# Patient Record
Sex: Male | Born: 1970 | Race: White | Hispanic: No | Marital: Single | Smoking: Current every day smoker
Health system: Southern US, Community
[De-identification: ages and names within clinical notes are randomized; demographics above are authoritative.]

## PROBLEM LIST (undated history)

## (undated) DIAGNOSIS — E079 Disorder of thyroid, unspecified: Secondary | ICD-10-CM

## (undated) DIAGNOSIS — I10 Essential (primary) hypertension: Secondary | ICD-10-CM

## (undated) DIAGNOSIS — G8929 Other chronic pain: Secondary | ICD-10-CM

## (undated) DIAGNOSIS — M549 Dorsalgia, unspecified: Secondary | ICD-10-CM

## (undated) HISTORY — PX: OTHER SURGICAL HISTORY: SHX169

---

## 2008-10-16 ENCOUNTER — Emergency Department (HOSPITAL_COMMUNITY): Admission: EM | Admit: 2008-10-16 | Discharge: 2008-10-16 | Payer: Self-pay | Admitting: Emergency Medicine

## 2009-08-18 ENCOUNTER — Emergency Department (HOSPITAL_COMMUNITY): Admission: EM | Admit: 2009-08-18 | Discharge: 2009-08-18 | Payer: Self-pay | Admitting: Family Medicine

## 2010-03-04 IMAGING — CR DG CHEST 2V
2 series · 2 of 2 positions shown · non-contrast
Comparison: None

CLINICAL DATA: Coughing up blood for 4 days.

CHEST - 2 VIEW

[w chest pa]
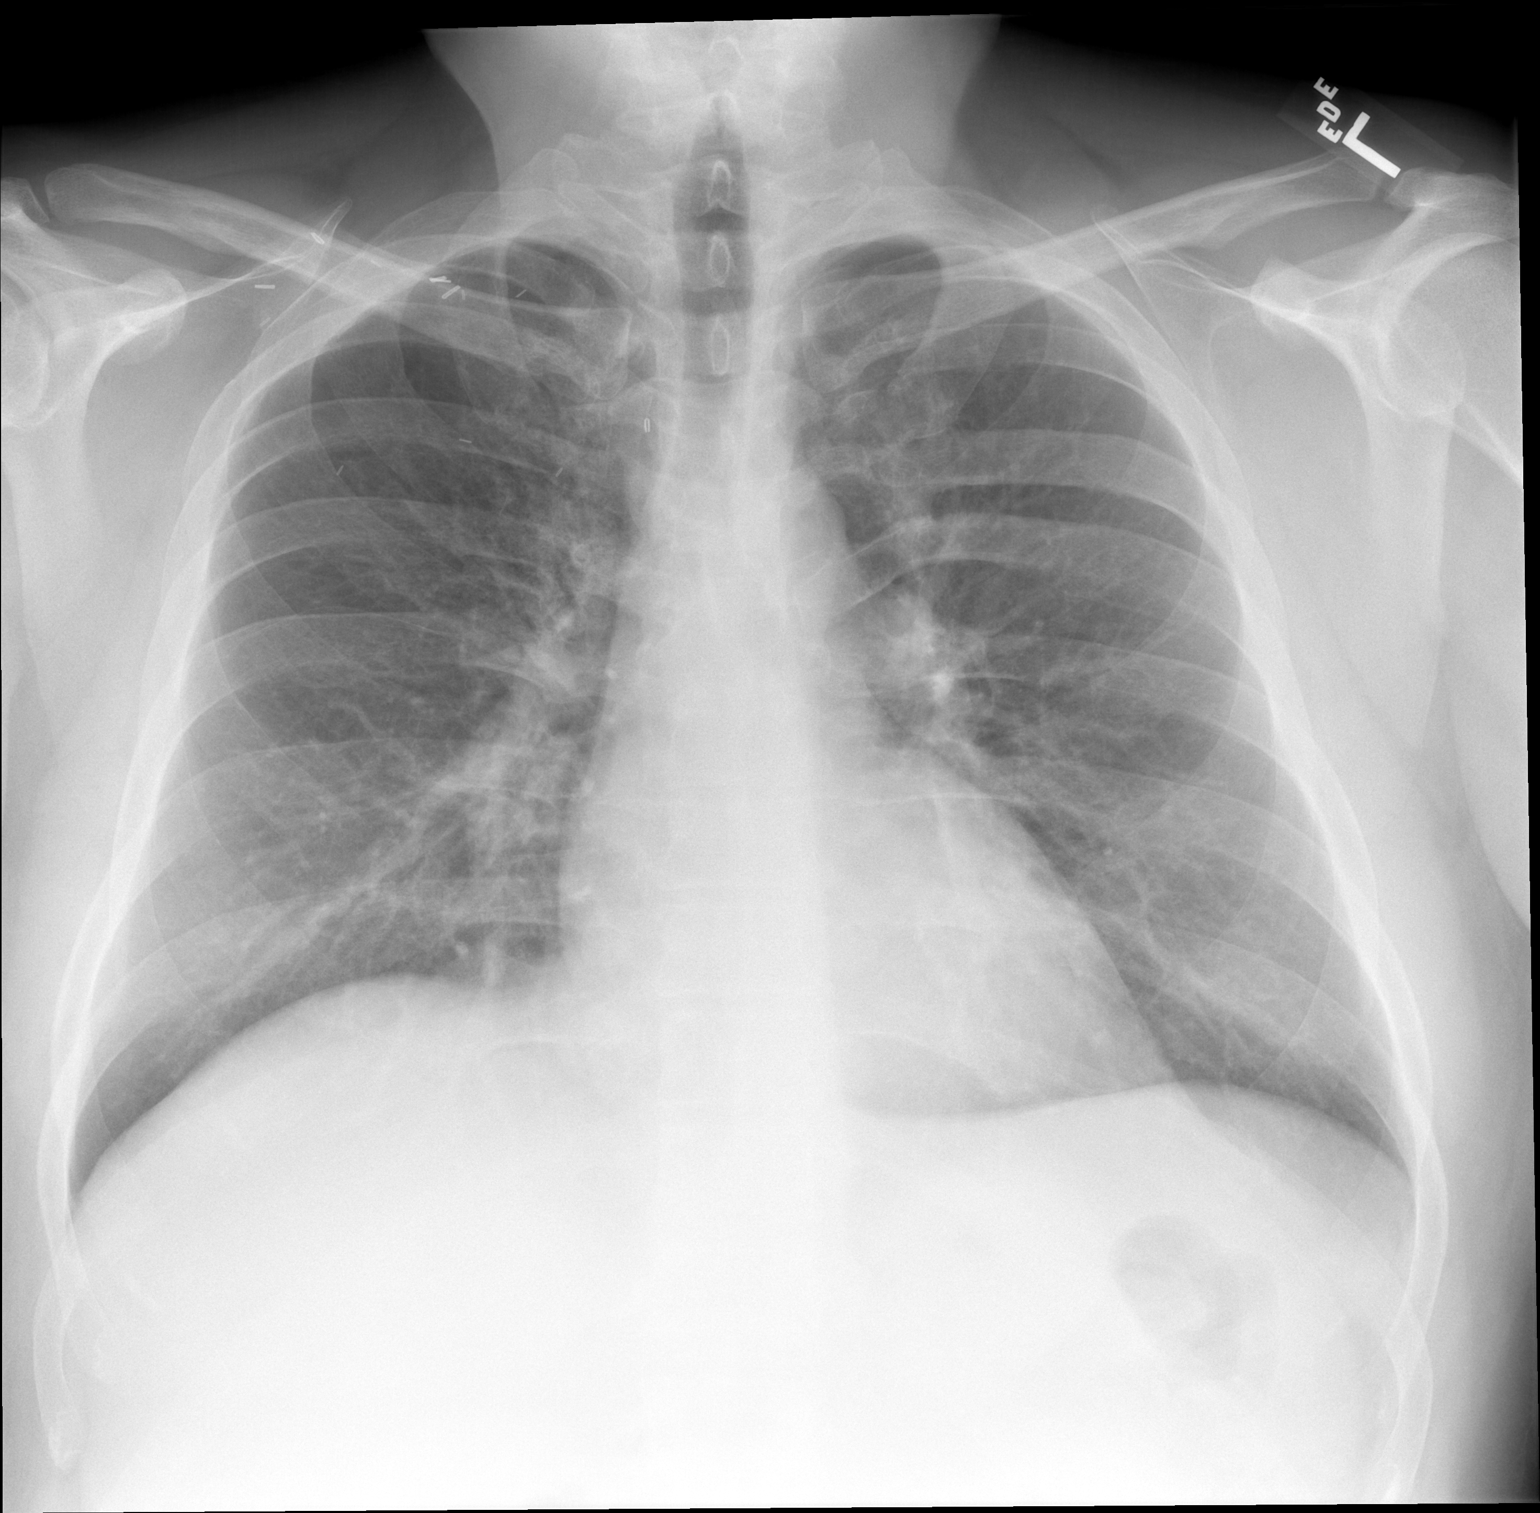

[w chest lat]
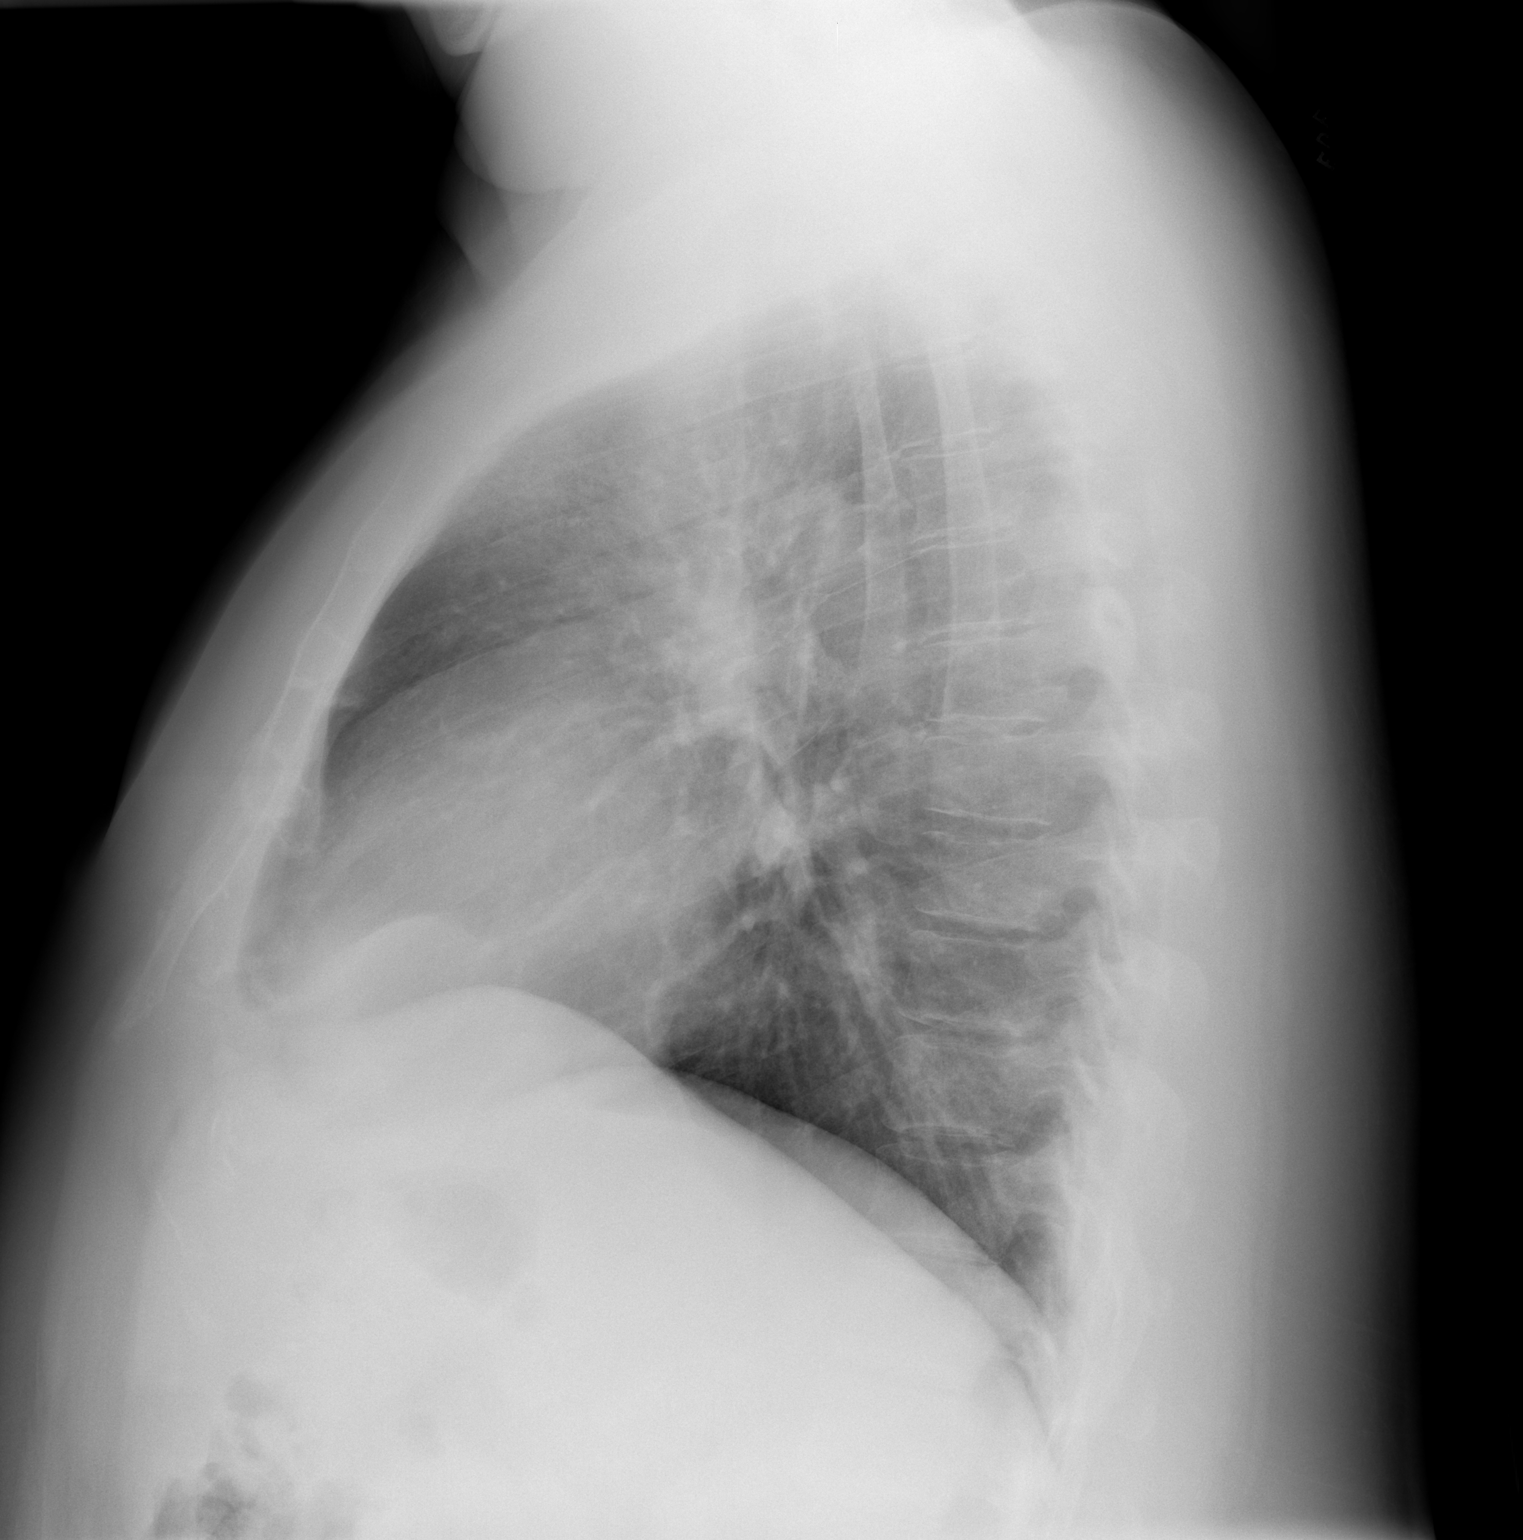

[2 of 2 positions shown; findings below may reference images not displayed]

FINDINGS: The heart size and mediastinal contours are normal.
Surgical clips overlie the right lung apex.  There is diffuse
interstitial prominence with mild central airway thickening.  No
confluent airspace opacity, mass or significant pleural effusion is
demonstrated.  The osseous structures appear normal.
IMPRESSION: 1.  Diffuse interstitial prominence suggesting mild bronchitis.  No
focal airspace disease.
2.  Radiographic followup is recommended if the patient has
persistent unexplained hemoptysis.

## 2011-09-01 LAB — POCT I-STAT, CHEM 8
BUN: 13
Creatinine, Ser: 1
Glucose, Bld: 101 — ABNORMAL HIGH
HCT: 48
Hemoglobin: 16.3
Potassium: 4
Sodium: 139

## 2014-05-10 ENCOUNTER — Encounter (HOSPITAL_COMMUNITY): Payer: Self-pay | Admitting: Emergency Medicine

## 2014-05-10 ENCOUNTER — Emergency Department (HOSPITAL_COMMUNITY)
Admission: EM | Admit: 2014-05-10 | Discharge: 2014-05-10 | Disposition: A | Discharge status: Home / Self Care | Payer: BC Managed Care – PPO | Attending: Emergency Medicine | Admitting: Emergency Medicine

## 2014-05-10 DIAGNOSIS — L03119 Cellulitis of unspecified part of limb: Principal | ICD-10-CM

## 2014-05-10 DIAGNOSIS — F17200 Nicotine dependence, unspecified, uncomplicated: Secondary | ICD-10-CM | POA: Insufficient documentation

## 2014-05-10 DIAGNOSIS — L02519 Cutaneous abscess of unspecified hand: Secondary | ICD-10-CM | POA: Insufficient documentation

## 2014-05-10 DIAGNOSIS — L0291 Cutaneous abscess, unspecified: Secondary | ICD-10-CM

## 2014-05-10 MED ORDER — LIDOCAINE HCL (PF) 2 % IJ SOLN
INTRAMUSCULAR | Status: AC
  Filled 2014-05-10: qty 10

## 2014-05-10 MED ORDER — SULFAMETHOXAZOLE-TMP DS 800-160 MG PO TABS: 1.0000 | ORAL_TABLET | Freq: Two times a day (BID) | ORAL | Status: DC

## 2014-05-10 MED ORDER — LIDOCAINE HCL (PF) 2 % IJ SOLN: 10.0000 mL | Freq: Once | INTRAMUSCULAR | Status: DC

## 2014-05-10 NOTE — Discharge Instructions (Signed)
Return to the ER on Sunday for a repeat examination and packing removal. Return sooner if you have increasing pain or swelling. Leave the dressing in place until you are seen on Sunday. You will also need to schedule followup with the hand surgeon listed, but that will not be able to happen until after the weekend.  Abscess An abscess is an infected area that contains a collection of pus and debris.It can occur in almost any part of the body. An abscess is also known as a furuncle or boil. CAUSES  An abscess occurs when tissue gets infected. This can occur from blockage of oil or sweat glands, infection of hair follicles, or a minor injury to the skin. As the body tries to fight the infection, pus collects in the area and creates pressure under the skin. This pressure causes pain. People with weakened immune systems have difficulty fighting infections and get certain abscesses more often.  SYMPTOMS Usually an abscess develops on the skin and becomes a painful mass that is red, warm, and tender. If the abscess forms under the skin, you may feel a moveable soft area under the skin. Some abscesses break open (rupture) on their own, but most will continue to get worse without care. The infection can spread deeper into the body and eventually into the bloodstream, causing you to feel ill.  DIAGNOSIS  Your caregiver will take your medical history and perform a physical exam. A sample of fluid may also be taken from the abscess to determine what is causing your infection. TREATMENT  Your caregiver may prescribe antibiotic medicines to fight the infection. However, taking antibiotics alone usually does not cure an abscess. Your caregiver may need to make a small cut (incision) in the abscess to drain the pus. In some cases, gauze is packed into the abscess to reduce pain and to continue draining the area. HOME CARE INSTRUCTIONS   Only take over-the-counter or prescription medicines for pain, discomfort, or  fever as directed by your caregiver.  If you were prescribed antibiotics, take them as directed. Finish them even if you start to feel better.  If gauze is used, follow your caregiver's directions for changing the gauze.  To avoid spreading the infection:  Keep your draining abscess covered with a bandage.  Wash your hands well.  Do not share personal care items, towels, or whirlpools with others.  Avoid skin contact with others.  Keep your skin and clothes clean around the abscess.  Keep all follow-up appointments as directed by your caregiver. SEEK MEDICAL CARE IF:   You have increased pain, swelling, redness, fluid drainage, or bleeding.  You have muscle aches, chills, or a general ill feeling.  You have a fever. MAKE SURE YOU:   Understand these instructions.  Will watch your condition.  Will get help right away if you are not doing well or get worse. Document Released: 08/25/2005 Document Revised: 05/16/2012 Document Reviewed: 01/28/2012 [facility] Patient Information 2014 [place], [place].

## 2014-05-10 NOTE — ED Notes (Signed)
Abscess to lt hand ,

## 2014-05-10 NOTE — ED Notes (Signed)
Abscess to left hand x 2 days. Hx of same to same hand, last time approx 6 months ago. Redness and swelling noted. No drainage from site. Denies fevers.

## 2014-05-10 NOTE — ED Provider Notes (Signed)
CSN: [id number]     Arrival date & time 05/10/14  1519 History   First MD Initiated Contact with Patient 05/10/14 1550     Chief Complaint  Patient presents with  . Abscess     (Consider location/radiation/quality/duration/timing/severity/associated sxs/prior Treatment) HPI Comments: Presents to the ER for evaluation of pain, swelling and redness over the back of the left hand. Symptoms began several days ago and have progressively worsened. Patient reports that he has had an abscess in this area before. He denies any direct injury to the region. There has not been any fever. There is no drainage from the site. Patient has not noticed any numbness, tingling or weakness in the fingers. He has full range of motion of the hand.  Patient is a 43 y.o. male presenting with abscess.  Abscess   History reviewed. No pertinent past medical history. Past Surgical History  Procedure Laterality Date  . Desmoid tumor removed     History reviewed. No pertinent family history. History  Substance Use Topics  . Smoking status: Current Every Day Smoker  . Smokeless tobacco: Not on file  . Alcohol Use: No    Review of Systems  Skin: Positive for color change.  All other systems reviewed and are negative.     Allergies  Review of patient's allergies indicates no known allergies.  Home Medications   Prior to Admission medications   Not on File   BP 163/105  Pulse 82  Temp(Src) 98.2 F (36.8 C) (Oral)  Resp 20  Ht 6' (1.829 m)  Wt 300 lb (136.079 kg)  BMI 40.68 kg/m2  SpO2 99% Physical Exam  Constitutional: He is oriented to person, place, and time. He appears well-developed and well-nourished. No distress.  HENT:  Head: Normocephalic and atraumatic.  Right Ear: Hearing normal.  Left Ear: Hearing normal.  Nose: Nose normal.  Mouth/Throat: Oropharynx is clear and moist and mucous membranes are normal.  Eyes: Conjunctivae and EOM are normal. Pupils are equal, round, and reactive  to light.  Neck: Normal range of motion. Neck supple.  Cardiovascular: Regular rhythm, S1 normal and S2 normal.  Exam reveals no gallop and no friction rub.   No murmur heard. Pulmonary/Chest: Effort normal and breath sounds normal. No respiratory distress. He exhibits no tenderness.  Abdominal: Soft. Normal appearance and bowel sounds are normal. There is no hepatosplenomegaly. There is no tenderness. There is no rebound, no guarding, no tenderness at McBurney's point and negative Murphy's sign. No hernia.  Musculoskeletal: Normal range of motion.       Hands: Neurological: He is alert and oriented to person, place, and time. He has normal strength. No cranial nerve deficit or sensory deficit. Coordination normal. GCS eye subscore is 4. GCS verbal subscore is 5. GCS motor subscore is 6.  Skin: Skin is warm, dry and intact. No rash noted. No cyanosis.     Psychiatric: He has a normal mood and affect. His speech is normal and behavior is normal. Thought content normal.    ED Course  Procedures (including critical care time)  INCISION AND DRAINAGE Performed by: Gilda CreasePOLLINA, CHRISTOPHER J. Consent: Verbal consent obtained. Risks and benefits: risks, benefits and alternatives were discussed Type: abscess  Body area: dorsal left hand  Anesthesia: local infiltration  Incision was made with a scalpel.  Local anesthetic: lidocaine 2% without epinephrine  Anesthetic total: 3 ml  Complexity: complex Blunt dissection to break up loculations  Drainage: purulent  Drainage amount: large  Packing material: 1/4 in  iodoform gauze  Patient tolerance: Patient tolerated the procedure well with no immediate complications.     Labs Review Labs Reviewed - No data to display  Imaging Review No results found.   EKG Interpretation None      MDM   Final diagnoses:  None  Abscess  Presents to ER for evaluation of swollen area on the back of her left hand. He had a tender fluctuant  region that was consistent with an abscess. This appeared to be contained within the subcutaneous tissues, as he did not have any pain with range of motion of his fingers and wrist. He has normal preserved range of motion, specifically extension, at all 4 fingers. I Recommended incision and drainage, patient gave consent after discussing risks and benefits. Procedure was performed a large amount of pus was drained from the area. It was packed and patient will be initiated on Bactrim. I will have him come back to the ER in 2 days (Sunday) for packing removal and recheck, also refer to hand surgery, but that will not be possible until next week.   Gilda Creasehristopher J. Pollina, MD 05/10/14 [id number]

## 2014-05-14 LAB — CULTURE, ROUTINE-ABSCESS

## 2014-07-04 ENCOUNTER — Ambulatory Visit (INDEPENDENT_AMBULATORY_CARE_PROVIDER_SITE_OTHER): Payer: BC Managed Care – PPO | Admitting: Nurse Practitioner

## 2014-07-04 ENCOUNTER — Encounter (INDEPENDENT_AMBULATORY_CARE_PROVIDER_SITE_OTHER): Payer: Self-pay

## 2014-07-04 VITALS — BP 142/77 | HR 87 | Temp 97.7°F | Ht 72.0 in | Wt 280.0 lb

## 2014-07-04 DIAGNOSIS — L02511 Cutaneous abscess of right hand: Secondary | ICD-10-CM

## 2014-07-04 DIAGNOSIS — L02519 Cutaneous abscess of unspecified hand: Secondary | ICD-10-CM

## 2014-07-04 DIAGNOSIS — M79609 Pain in unspecified limb: Secondary | ICD-10-CM

## 2014-07-04 DIAGNOSIS — L03119 Cellulitis of unspecified part of limb: Secondary | ICD-10-CM

## 2014-07-04 MED ORDER — CEFTRIAXONE SODIUM 1 G IJ SOLR
1.0000 g | INTRAMUSCULAR | Status: AC
  Administered 2014-07-04: 1 g via INTRAMUSCULAR

## 2014-07-04 MED ORDER — CEPHALEXIN 500 MG PO CAPS: 500.0000 mg | ORAL_CAPSULE | Freq: Four times a day (QID) | ORAL | Status: DC

## 2014-07-04 NOTE — Progress Notes (Signed)
   Subjective:    Patient ID: Joseph Stafford, male    DOB: 25-Nov-1971, 43 y.o.   MRN: [id number]  HPI  Swelling, redness, and pain in right hand 3 days ago.  Not aware of any insect bites.    Review of Systems  Constitutional: Negative.   Respiratory: Negative.   Cardiovascular: Negative.   Skin: Positive for color change.       Right hand red and swollen       Objective:   Physical Exam  Constitutional: He appears well-developed and well-nourished. He appears distressed.  Skin: Skin is warm.  Right hand red with swelling above wrist  Psychiatric: His mood appears anxious.    Procedure:  Lidocaine 2% with epi 2ml local  claeaned with betadine  #11 blade- copious amounts of foul smelling pus- culture obtained  Cleaned with NACL  Pressure dressing applied      Assessment & Plan:   1. Abscess of right hand    Meds ordered this encounter  Medications  . cefTRIAXone (ROCEPHIN) injection 1 g    Sig:     Order Specific Question:  Antibiotic Indication:    Answer:  Cellulitis  . cephALEXin (KEFLEX) 500 MG capsule    Sig: Take 1 capsule (500 mg total) by mouth 4 (four) times daily.    Dispense:  30 capsule    Refill:  0    Order Specific Question:  Supervising Provider    Answer:  Christell ConstantMOORE, DONALD W [[id number]]   Soak in epsom salt BID starting tomorrow Keep clean dry and covered Take antibiotics as rx RTO if not improving  Mary-Margaret Daphine DeutscherMartin, FNP

## 2014-07-04 NOTE — Patient Instructions (Signed)

## 2014-07-07 LAB — AEROBIC CULTURE

## 2017-01-24 ENCOUNTER — Encounter (HOSPITAL_COMMUNITY): Payer: Self-pay | Admitting: Emergency Medicine

## 2017-01-24 ENCOUNTER — Emergency Department (HOSPITAL_COMMUNITY)
Admission: EM | Admit: 2017-01-24 | Discharge: 2017-01-24 | Disposition: A | Discharge status: Home / Self Care | Payer: BLUE CROSS/BLUE SHIELD | Attending: Emergency Medicine | Admitting: Emergency Medicine

## 2017-01-24 DIAGNOSIS — Z79899 Other long term (current) drug therapy: Secondary | ICD-10-CM | POA: Diagnosis not present

## 2017-01-24 DIAGNOSIS — I10 Essential (primary) hypertension: Secondary | ICD-10-CM | POA: Insufficient documentation

## 2017-01-24 DIAGNOSIS — F17200 Nicotine dependence, unspecified, uncomplicated: Secondary | ICD-10-CM | POA: Diagnosis not present

## 2017-01-24 DIAGNOSIS — L0291 Cutaneous abscess, unspecified: Secondary | ICD-10-CM

## 2017-01-24 DIAGNOSIS — L02511 Cutaneous abscess of right hand: Secondary | ICD-10-CM | POA: Diagnosis not present

## 2017-01-24 HISTORY — DX: Disorder of thyroid, unspecified: E07.9

## 2017-01-24 HISTORY — DX: Essential (primary) hypertension: I10

## 2017-01-24 HISTORY — DX: Dorsalgia, unspecified: M54.9

## 2017-01-24 HISTORY — DX: Other chronic pain: G89.29

## 2017-01-24 LAB — CBC WITH DIFFERENTIAL/PLATELET
BASOS PCT: 0 %
Basophils Absolute: 0 10*3/uL (ref 0.0–0.1)
Eosinophils Absolute: 0.3 10*3/uL (ref 0.0–0.7)
Eosinophils Relative: 4 %
HEMATOCRIT: 40.8 % (ref 39.0–52.0)
HEMOGLOBIN: 13.9 g/dL (ref 13.0–17.0)
LYMPHS PCT: 17 %
Lymphs Abs: 1.5 10*3/uL (ref 0.7–4.0)
MCH: 29.8 pg (ref 26.0–34.0)
MCHC: 34.1 g/dL (ref 30.0–36.0)
MCV: 87.4 fL (ref 78.0–100.0)
MONO ABS: 0.6 10*3/uL (ref 0.1–1.0)
MONOS PCT: 7 %
NEUTROS ABS: 6.3 10*3/uL (ref 1.7–7.7)
NEUTROS PCT: 72 %
Platelets: 339 10*3/uL (ref 150–400)
RBC: 4.67 MIL/uL (ref 4.22–5.81)
RDW: 14.2 % (ref 11.5–15.5)
WBC: 8.8 10*3/uL (ref 4.0–10.5)

## 2017-01-24 LAB — COMPREHENSIVE METABOLIC PANEL
ALBUMIN: 3.6 g/dL (ref 3.5–5.0)
ALK PHOS: 80 U/L (ref 38–126)
ALT: 48 U/L (ref 17–63)
ANION GAP: 8 (ref 5–15)
AST: 29 U/L (ref 15–41)
BILIRUBIN TOTAL: 0.2 mg/dL — AB (ref 0.3–1.2)
BUN: 15 mg/dL (ref 6–20)
CALCIUM: 9 mg/dL (ref 8.9–10.3)
CO2: 24 mmol/L (ref 22–32)
Chloride: 105 mmol/L (ref 101–111)
Creatinine, Ser: 0.88 mg/dL (ref 0.61–1.24)
GFR calc Af Amer: >60 mL/min (ref >60)
GLUCOSE: 118 mg/dL — AB (ref 65–99)
Potassium: 4.2 mmol/L (ref 3.5–5.1)
Sodium: 137 mmol/L (ref 135–145)
TOTAL PROTEIN: 7.5 g/dL (ref 6.5–8.1)

## 2017-01-24 MED ORDER — OXYCODONE-ACETAMINOPHEN 5-325 MG PO TABS: 1.0000 | ORAL_TABLET | Freq: Four times a day (QID) | ORAL | 0 refills | Status: DC | PRN

## 2017-01-24 MED ORDER — POVIDONE-IODINE 10 % EX SOLN
CUTANEOUS | Status: AC
  Administered 2017-01-24: 19:00:00
  Filled 2017-01-24: qty 118

## 2017-01-24 MED ORDER — ONDANSETRON HCL 4 MG/2ML IJ SOLN
4.0000 mg | Freq: Once | INTRAMUSCULAR | Status: AC
  Administered 2017-01-24: 4 mg via INTRAVENOUS
  Filled 2017-01-24: qty 2

## 2017-01-24 MED ORDER — LIDOCAINE HCL (PF) 2 % IJ SOLN
10.0000 mL | Freq: Once | INTRAMUSCULAR | Status: AC
  Administered 2017-01-24: 10 mL
  Filled 2017-01-24: qty 10

## 2017-01-24 MED ORDER — HYDROMORPHONE HCL 1 MG/ML IJ SOLN
1.0000 mg | Freq: Once | INTRAMUSCULAR | Status: AC
  Administered 2017-01-24: 1 mg via INTRAVENOUS
  Filled 2017-01-24: qty 1

## 2017-01-24 MED ORDER — DOXYCYCLINE HYCLATE 100 MG PO CAPS: 100.0000 mg | ORAL_CAPSULE | Freq: Two times a day (BID) | ORAL | 0 refills | Status: DC

## 2017-01-24 MED ORDER — VANCOMYCIN HCL IN DEXTROSE 1-5 GM/200ML-% IV SOLN
1000.0000 mg | Freq: Once | INTRAVENOUS | Status: AC
  Administered 2017-01-24: 1000 mg via INTRAVENOUS
  Filled 2017-01-24: qty 200

## 2017-01-24 NOTE — ED Provider Notes (Signed)
[facility]EMER[facility]ENCY DEPT Provider Note   CSN: [id number] Arrival date & time: 01/24/17  1707     History   Chief Complaint Chief Complaint  Patient presents with  . Abscess    HPI Joseph Stafford is a 46 y.o. male.  Patient complains of swelling to his right wrist ulcer to his right leg and small abscess to abdomen   The history is provided by the patient. No language interpreter was used.  Abscess  Abscess location: Right hand. Abscess quality: fluctuance   Red streaking: yes   Progression:  Worsening Chronicity:  New Context: not diabetes   Associated symptoms: no fatigue and no headaches     Past Medical History:  Diagnosis Date  . Chronic back pain   . Hypertension   . Thyroid disease     There are no active problems to display for this patient.   Past Surgical History:  Procedure Laterality Date  . desmoid tumor removed         Home Medications    Prior to Admission medications   Medication Sig Start Date End Date Taking? Authorizing Provider  ALPRAZolam Prudy Feeler(XANAX) 1 MG tablet Take 1 mg by mouth daily. 05/03/14   Historical Provider, MD  cephALEXin (KEFLEX) 500 MG capsule Take 1 capsule (500 mg total) by mouth 4 (four) times daily. 07/04/14   Mary-Margaret Daphine DeutscherMartin, FNP  cloNIDine (CATAPRES) 0.2 MG tablet Take 1 tablet by mouth 2 (two) times daily. 04/26/14   Historical Provider, MD  Cyanocobalamin (B-12 PO) Take 1 tablet by mouth every morning.    Historical Provider, MD  doxycycline (VIBRAMYCIN) 100 MG capsule Take 1 capsule (100 mg total) by mouth 2 (two) times daily. One po bid x 7 days 01/24/17   Bethann BerkshireJoseph Shion Bluestein, MD  fentaNYL (DURAGESIC - DOSED MCG/HR) 50 MCG/HR Place 1 patch onto the skin every other day. Changes patches every 2 days 04/28/14   Historical Provider, MD  hydrochlorothiazide (HYDRODIURIL) 25 MG tablet Take 1 tablet by mouth every morning. 04/26/14   Historical Provider, MD  levothyroxine (SYNTHROID, LEVOTHROID) 75 MCG tablet 1 tablet every morning.  04/26/14   Historical Provider, MD  losartan (COZAAR) 100 MG tablet Take 100 mg by mouth every morning. 04/26/14   Historical Provider, MD  oxycodone (ROXICODONE) 30 MG immediate release tablet Take 30 mg by mouth 4 (four) times daily. 04/28/14   Historical Provider, MD  oxyCODONE-acetaminophen (PERCOCET/ROXICET) 5-325 MG tablet Take 1 tablet by mouth every 6 (six) hours as needed. 01/24/17   Bethann BerkshireJoseph Ezequias Lard, MD    Family History History reviewed. No pertinent family history.  Social History Social History  Substance Use Topics  . Smoking status: Current Every Day Smoker  . Smokeless tobacco: Never Used  . Alcohol use No     Allergies   Ivp dye [iodinated diagnostic agents]   Review of Systems Review of Systems  Constitutional: Negative for appetite change and fatigue.  HENT: Negative for congestion, ear discharge and sinus pressure.   Eyes: Negative for discharge.  Respiratory: Negative for cough.   Cardiovascular: Negative for chest pain.  Gastrointestinal: Negative for abdominal pain and diarrhea.  Genitourinary: Negative for frequency and hematuria.  Musculoskeletal: Negative for back pain.  Skin: Negative for rash.       Abscess to right hand  Neurological: Negative for seizures and headaches.  Psychiatric/Behavioral: Negative for hallucinations.     Physical Exam Updated Vital Signs BP 182/92 (BP Location: Left Arm)   Pulse 88   Temp 98 F (  36.7 C) (Oral)   Resp 19   Ht 6' (1.829 m)   Wt 260 lb (117.9 kg)   SpO2 97%   BMI 35.26 kg/m   Physical Exam  Constitutional: He is oriented to person, place, and time. He appears well-developed.  HENT:  Head: Normocephalic.  Eyes: Conjunctivae and EOM are normal. No scleral icterus.  Neck: Neck supple. No thyromegaly present.  Cardiovascular: Normal rate and regular rhythm.  Exam reveals no gallop and no friction rub.   No murmur heard. Pulmonary/Chest: No stridor. He has no wheezes. He has no rales. He exhibits no  tenderness.  Abdominal: He exhibits no distension. There is no tenderness. There is no rebound.  Patient has a 1 cm area of redness to his abdomen that appears to be infected.  Musculoskeletal: Normal range of motion. He exhibits no edema.  Patient has a large abscess to the back with hand. Abscesses on his right hand. Patient also has an ulcer to his right ankle  Lymphadenopathy:    He has no cervical adenopathy.  Neurological: He is oriented to person, place, and time. He exhibits normal muscle tone. Coordination normal.  Skin: No rash noted. No erythema.  Psychiatric: He has a normal mood and affect. His behavior is normal.     ED Treatments / Results  Labs (all labs ordered are listed, but only abnormal results are displayed) Labs Reviewed  COMPREHENSIVE METABOLIC PANEL - Abnormal; Notable for the following:       Result Value   Glucose, Bld 118 (*)    Total Bilirubin 0.2 (*)    All other components within normal limits  AEROBIC CULTURE (SUPERFICIAL SPECIMEN)  CBC WITH DIFFERENTIAL/PLATELET    EKG  EKG Interpretation None       Radiology No results found.  Procedures Procedures (including critical care time)  Medications Ordered in ED Medications  vancomycin (VANCOCIN) IVPB 1000 mg/200 mL premix (0 mg Intravenous Stopped 01/24/17 1921)  HYDROmorphone (DILAUDID) injection 1 mg (1 mg Intravenous Given 01/24/17 1821)  ondansetron (ZOFRAN) injection 4 mg (4 mg Intravenous Given 01/24/17 1821)  lidocaine (XYLOCAINE) 2 % injection 10 mL (10 mLs Infiltration Given 01/24/17 1937)  povidone-iodine (BETADINE) 10 % external solution (  Given 01/24/17 1910)     Initial Impression / Assessment and Plan / ED Course  I have reviewed the triage vital signs and the nursing notes.  Pertinent labs & imaging results that were available during my care of the patient were reviewed by me and considered in my medical decision making (see chart for details).     Patient with abscess to  right hand that was high and D. Cultures were done. Patient will be put on doxycycline and follow-up with Dr. Romeo Apple  Final Clinical Impressions(s) / ED Diagnoses   Final diagnoses:  Abscess    New Prescriptions New Prescriptions   DOXYCYCLINE (VIBRAMYCIN) 100 MG CAPSULE    Take 1 capsule (100 mg total) by mouth 2 (two) times daily. One po bid x 7 days   OXYCODONE-ACETAMINOPHEN (PERCOCET/ROXICET) 5-325 MG TABLET    Take 1 tablet by mouth every 6 (six) hours as needed.     Bethann Berkshire, MD 01/24/17 [phone]

## 2017-01-24 NOTE — ED Notes (Signed)
Pt states understanding of care given and follow up instructions.  Pt ambulated from Ed with steady gait, alert, and oriented

## 2017-01-24 NOTE — ED Notes (Signed)
Attempted to obtain IV in right AC. Unsuccessful.

## 2017-01-24 NOTE — ED Triage Notes (Signed)
Pt c/o abscess to R foot x 1 week, one to right hand and one to R upper abd. States all came up over last week. Pt concerned for mrsa

## 2017-01-24 NOTE — Discharge Instructions (Signed)
Follow-up with Dr. Romeo AppleHarrison on Wednesday at 9 AM. Follow-up with her family doctor in the next week

## 2017-01-24 NOTE — ED Provider Notes (Signed)
INCISION AND DRAINAGE Performed by: Jimmye NormanSMITH,Carlo Lorson JOHN Consent: Verbal consent obtained. Risks and benefits: risks, benefits and alternatives were discussed Type: abscess  Body area: dorsum right hand  Anesthesia: local infiltration  Incision was made with a scalpel.  Local anesthetic: lidocaine 2%   Anesthetic total: 2 ml  Complexity: complex  Blunt dissection to break up loculations  Drainage: purulent  Drainage amount: moderate  Packing material: 1/4 in iodoform gauze  Patient tolerance: Patient tolerated the procedure well with no immediate complications.     Felicie Mornavid Tylie Golonka, NP 01/24/17 1934    Bethann BerkshireJoseph Zammit, MD 01/24/17 2127

## 2017-01-28 LAB — AEROBIC CULTURE  (SUPERFICIAL SPECIMEN): SPECIAL REQUESTS: NORMAL

## 2017-01-28 LAB — AEROBIC CULTURE W GRAM STAIN (SUPERFICIAL SPECIMEN)

## 2017-01-29 ENCOUNTER — Telehealth: Payer: Self-pay | Admitting: *Deleted

## 2017-01-29 NOTE — Progress Notes (Signed)
ED Antimicrobial Stewardship Positive Culture Follow Up   Joseph Stafford is an 46 y.o. male [facility] with a chief complaint of  Chief Complaint  Patient presents with  . Abscess    Recent Results (from the past 720 hour(s))  Wound or Superficial Culture     Status: None   Collection Time: 01/24/17  7:30 PM  Result Value Ref Range Status   Specimen Description ABSCESS  Final   Special Requests Normal  Final   Gram Stain   Final    FEW WBC PRESENT,BOTH PMN AND MONONUCLEAR FEW GRAM POSITIVE COCCI Performed at [facility], [address] [place], [place] [address]    Culture FEW STREPTOCOCCUS INTERMEDIUS  Final   Report Status 01/28/2017 FINAL  Final   Organism ID, Bacteria STREPTOCOCCUS INTERMEDIUS  Final      Susceptibility   Streptococcus intermedius - MIC*    PENICILLIN <=0.06 SENSITIVE Sensitive     CEFTRIAXONE <=0.12 SENSITIVE Sensitive     ERYTHROMYCIN >=8 RESISTANT Resistant     LEVOFLOXACIN 0.5 SENSITIVE Sensitive     VANCOMYCIN 0.5 SENSITIVE Sensitive     * FEW STREPTOCOCCUS INTERMEDIUS   Will call pt for symptom check If improved, cont doxy If no improvement change to augmentin 875 bid x 10 d  ED Provider: Willette ClusterWill Dansie, Joseph Stafford  Joseph Stafford 01/29/2017, 8:43 AM Infectious Diseases Pharmacist Phone# [phone]

## 2017-01-29 NOTE — Telephone Encounter (Signed)
Post ED Visit - Positive Culture Follow-up  Culture report reviewed by antimicrobial stewardship pharmacist:  []  Enzo BiNathan Batchelder, Pharm.D. []  Celedonio MiyamotoJeremy Frens, Pharm.D., BCPS []  Garvin FilaMike Maccia, Pharm.D. []  Georgina PillionElizabeth Martin, Pharm.D., BCPS []  Turkey CreekMinh Pham, [place]., BCPS, AAHIVP []  Estella HuskMichelle Turner, Pharm.D., BCPS, AAHIVP []  Tennis Mustassie Stewart, Pharm.D. []  Sherle Poeob Vincent, [place]D.  Positive wound culture  Chart reviewed by Betsy CoderWill Dansie, PA.  Treated with Doxycycline at ED visit, recommends follow up treatment with Augmentin if wound not improved.  Spoke with spouse of patient who states that wound is healing and much improved, no further treatment provided. Virl AxeRobertson, Curtisha [facility] 01/29/2017, 9:40 AM

## 2017-03-01 ENCOUNTER — Telehealth: Payer: Self-pay | Admitting: Emergency Medicine

## 2017-03-01 NOTE — Telephone Encounter (Signed)
LOST TO FOLLOWUP no response to letter 

## 2017-03-14 ENCOUNTER — Ambulatory Visit: Payer: Self-pay | Admitting: Physician Assistant

## 2017-03-28 ENCOUNTER — Ambulatory Visit (INDEPENDENT_AMBULATORY_CARE_PROVIDER_SITE_OTHER): Payer: BLUE CROSS/BLUE SHIELD | Admitting: Physician Assistant

## 2017-03-28 ENCOUNTER — Encounter: Payer: Self-pay | Admitting: Physician Assistant

## 2017-03-28 VITALS — BP 186/120 | HR 92 | Temp 98.7°F | Ht 72.0 in | Wt 247.0 lb

## 2017-03-28 DIAGNOSIS — G894 Chronic pain syndrome: Secondary | ICD-10-CM | POA: Diagnosis not present

## 2017-03-28 DIAGNOSIS — I10 Essential (primary) hypertension: Secondary | ICD-10-CM | POA: Insufficient documentation

## 2017-03-28 DIAGNOSIS — M5136 Other intervertebral disc degeneration, lumbar region: Secondary | ICD-10-CM | POA: Diagnosis not present

## 2017-03-28 DIAGNOSIS — E039 Hypothyroidism, unspecified: Secondary | ICD-10-CM | POA: Diagnosis not present

## 2017-03-28 DIAGNOSIS — Z86018 Personal history of other benign neoplasm: Secondary | ICD-10-CM

## 2017-03-28 MED ORDER — COMBIVENT RESPIMAT 20-100 MCG/ACT IN AERS: 1.0000 | INHALATION_SPRAY | Freq: Four times a day (QID) | RESPIRATORY_TRACT | 11 refills | Status: DC | PRN

## 2017-03-28 MED ORDER — OXYCODONE HCL 30 MG PO TABS: 30.0000 mg | ORAL_TABLET | Freq: Four times a day (QID) | ORAL | 0 refills | Status: DC

## 2017-03-28 MED ORDER — OXYCODONE HCL 30 MG PO TABS: 30.0000 mg | ORAL_TABLET | ORAL | 0 refills | Status: DC | PRN

## 2017-03-28 MED ORDER — TOPIRAMATE 25 MG PO TABS: 25.0000 mg | ORAL_TABLET | Freq: Every day | ORAL | 5 refills | Status: DC

## 2017-03-28 MED ORDER — LISINOPRIL 20 MG PO TABS: 20.0000 mg | ORAL_TABLET | Freq: Every day | ORAL | 3 refills | Status: DC

## 2017-03-28 NOTE — Patient Instructions (Signed)
DASH Eating Plan DASH stands for "Dietary Approaches to Stop Hypertension." The DASH eating plan is a healthy eating plan that has been shown to reduce high blood pressure (hypertension). It may also reduce your risk for type 2 diabetes, heart disease, and stroke. The DASH eating plan may also help with weight loss. What are tips for following this plan? General guidelines  Avoid eating more than 2,300 mg (milligrams) of salt (sodium) a day. If you have hypertension, you may need to reduce your sodium intake to 1,500 mg a day.  Limit alcohol intake to no more than 1 drink a day for nonpregnant women and 2 drinks a day for men. One drink equals 12 oz of beer, 5 oz of wine, or 1 oz of hard liquor.  Work with your health care provider to maintain a healthy body weight or to lose weight. Ask what an ideal weight is for you.  Get at least 30 minutes of exercise that causes your heart to beat faster (aerobic exercise) most days of the week. Activities may include walking, swimming, or biking.  Work with your health care provider or diet and nutrition specialist (dietitian) to adjust your eating plan to your individual calorie needs. Reading food labels  Check food labels for the amount of sodium per serving. Choose foods with less than 5 percent of the Daily Value of sodium. Generally, foods with less than 300 mg of sodium per serving fit into this eating plan.  To find whole grains, look for the word "whole" as the first word in the ingredient list. Shopping  Buy products labeled as "low-sodium" or "no salt added."  Buy fresh foods. Avoid canned foods and premade or frozen meals. Cooking  Avoid adding salt when cooking. Use salt-free seasonings or herbs instead of table salt or sea salt. Check with your health care provider or pharmacist before using salt substitutes.  Do not fry foods. Cook foods using healthy methods such as baking, boiling, grilling, and broiling instead.  Cook with  heart-healthy oils, such as olive, canola, soybean, or sunflower oil. Meal planning   Eat a balanced diet that includes: ? 5 or more servings of fruits and vegetables each day. At each meal, try to fill half of your plate with fruits and vegetables. ? Up to 6-8 servings of whole grains each day. ? Less than 6 oz of lean meat, poultry, or fish each day. A 3-oz serving of meat is about the same size as a deck of cards. One egg equals 1 oz. ? 2 servings of low-fat dairy each day. ? A serving of nuts, seeds, or beans 5 times each week. ? Heart-healthy fats. Healthy fats called Omega-3 fatty acids are found in foods such as flaxseeds and coldwater fish, like sardines, salmon, and mackerel.  Limit how much you eat of the following: ? Canned or prepackaged foods. ? Food that is high in trans fat, such as fried foods. ? Food that is high in saturated fat, such as fatty meat. ? Sweets, desserts, sugary drinks, and other foods with added sugar. ? Full-fat dairy products.  Do not salt foods before eating.  Try to eat at least 2 vegetarian meals each week.  Eat more home-cooked food and less restaurant, buffet, and fast food.  When eating at a restaurant, ask that your food be prepared with less salt or no salt, if possible. What foods are recommended? The items listed may not be a complete list. Talk with your dietitian about what   dietary choices are best for you. Grains Whole-grain or whole-wheat bread. Whole-grain or whole-wheat pasta. Brown rice. Oatmeal. Quinoa. Bulgur. Whole-grain and low-sodium cereals. Pita bread. Low-fat, low-sodium crackers. Whole-wheat flour tortillas. Vegetables Fresh or frozen vegetables (raw, steamed, roasted, or grilled). Low-sodium or reduced-sodium tomato and vegetable juice. Low-sodium or reduced-sodium tomato sauce and tomato paste. Low-sodium or reduced-sodium canned vegetables. Fruits All fresh, dried, or frozen fruit. Canned fruit in natural juice (without  added sugar). Meat and other protein foods Skinless chicken or turkey. Ground chicken or turkey. Pork with fat trimmed off. Fish and seafood. Egg whites. Dried beans, peas, or lentils. Unsalted nuts, nut butters, and seeds. Unsalted canned beans. Lean cuts of beef with fat trimmed off. Low-sodium, lean deli meat. Dairy Low-fat (1%) or fat-free (skim) milk. Fat-free, low-fat, or reduced-fat cheeses. Nonfat, low-sodium ricotta or cottage cheese. Low-fat or nonfat yogurt. Low-fat, low-sodium cheese. Fats and oils Soft margarine without trans fats. Vegetable oil. Low-fat, reduced-fat, or light mayonnaise and salad dressings (reduced-sodium). Canola, safflower, olive, soybean, and sunflower oils. Avocado. Seasoning and other foods Herbs. Spices. Seasoning mixes without salt. Unsalted popcorn and pretzels. Fat-free sweets. What foods are not recommended? The items listed may not be a complete list. Talk with your dietitian about what dietary choices are best for you. Grains Baked goods made with fat, such as croissants, muffins, or some breads. Dry pasta or rice meal packs. Vegetables Creamed or fried vegetables. Vegetables in a cheese sauce. Regular canned vegetables (not low-sodium or reduced-sodium). Regular canned tomato sauce and paste (not low-sodium or reduced-sodium). Regular tomato and vegetable juice (not low-sodium or reduced-sodium). Pickles. Olives. Fruits Canned fruit in a light or heavy syrup. Fried fruit. Fruit in cream or butter sauce. Meat and other protein foods Fatty cuts of meat. Ribs. Fried meat. Bacon. Sausage. Bologna and other processed lunch meats. Salami. Fatback. Hotdogs. Bratwurst. Salted nuts and seeds. Canned beans with added salt. Canned or smoked fish. Whole eggs or egg yolks. Chicken or turkey with skin. Dairy Whole or 2% milk, cream, and half-and-half. Whole or full-fat cream cheese. Whole-fat or sweetened yogurt. Full-fat cheese. Nondairy creamers. Whipped toppings.  Processed cheese and cheese spreads. Fats and oils Butter. Stick margarine. Lard. Shortening. Ghee. Bacon fat. Tropical oils, such as coconut, palm kernel, or palm oil. Seasoning and other foods Salted popcorn and pretzels. Onion salt, garlic salt, seasoned salt, table salt, and sea salt. Worcestershire sauce. Tartar sauce. Barbecue sauce. Teriyaki sauce. Soy sauce, including reduced-sodium. Steak sauce. Canned and packaged gravies. Fish sauce. Oyster sauce. Cocktail sauce. Horseradish that you find on the shelf. Ketchup. Mustard. Meat flavorings and tenderizers. Bouillon cubes. Hot sauce and Tabasco sauce. Premade or packaged marinades. Premade or packaged taco seasonings. Relishes. Regular salad dressings. Where to find more information:  National Heart, Lung, and Blood Institute: www.nhlbi.nih.gov  American Heart Association: www.heart.org Summary  The DASH eating plan is a healthy eating plan that has been shown to reduce high blood pressure (hypertension). It may also reduce your risk for type 2 diabetes, heart disease, and stroke.  With the DASH eating plan, you should limit salt (sodium) intake to 2,300 mg a day. If you have hypertension, you may need to reduce your sodium intake to 1,500 mg a day.  When on the DASH eating plan, aim to eat more fresh fruits and vegetables, whole grains, lean proteins, low-fat dairy, and heart-healthy fats.  Work with your health care provider or diet and nutrition specialist (dietitian) to adjust your eating plan to your individual   calorie needs. This information is not intended to replace advice given to you by your health care provider. Make sure you discuss any questions you have with your health care provider. Document Released: 11/04/2011 Document Revised: 11/08/2016 Document Reviewed: 11/08/2016 Elsevier Interactive Patient Education  2017 Elsevier Inc.  
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
## 2017-04-27 ENCOUNTER — Telehealth: Payer: Self-pay | Admitting: Physician Assistant

## 2017-04-27 DIAGNOSIS — M5136 Other intervertebral disc degeneration, lumbar region: Secondary | ICD-10-CM

## 2017-04-27 MED ORDER — OXYCODONE HCL 30 MG PO TABS: 30.0000 mg | ORAL_TABLET | Freq: Four times a day (QID) | ORAL | 0 refills | Status: DC

## 2017-04-27 MED ORDER — OXYCODONE HCL 30 MG PO TABS
30.0000 mg | ORAL_TABLET | Freq: Four times a day (QID) | ORAL | 0 refills | Status: DC | PRN
Start: 2017-04-27 — End: 2017-06-27

## 2017-04-27 MED ORDER — OXYCODONE HCL 30 MG PO TABS: 30.0000 mg | ORAL_TABLET | Freq: Four times a day (QID) | ORAL | 0 refills | Status: DC | PRN

## 2017-04-27 NOTE — Telephone Encounter (Signed)
Directions for oxycodone are different. Is it supposed to be- 4 times daily or take every 4 hours?

## 2017-04-27 NOTE — Telephone Encounter (Signed)
Patient aware.

## 2017-04-27 NOTE — Telephone Encounter (Signed)
[facility] states they need new prescriptions since it is a controled substance. Patient has already filled the first one so he just needs two rx's. Needs to say fill 5/30 on one. Please advise and send back to pools

## 2017-04-27 NOTE — Telephone Encounter (Signed)
Should be 4 tabs per day, QID. Not Q 4 hours.  If he will just take them QID and on the next visit I will correct the two labels that are wrong.

## 2017-05-18 ENCOUNTER — Other Ambulatory Visit: Payer: Self-pay | Admitting: Physician Assistant

## 2017-06-27 ENCOUNTER — Ambulatory Visit (INDEPENDENT_AMBULATORY_CARE_PROVIDER_SITE_OTHER): Payer: BLUE CROSS/BLUE SHIELD | Admitting: Physician Assistant

## 2017-06-27 ENCOUNTER — Encounter: Payer: Self-pay | Admitting: Physician Assistant

## 2017-06-27 VITALS — BP 152/88 | HR 84 | Temp 97.6°F | Ht 72.0 in | Wt 262.0 lb

## 2017-06-27 DIAGNOSIS — M5136 Other intervertebral disc degeneration, lumbar region: Secondary | ICD-10-CM | POA: Diagnosis not present

## 2017-06-27 DIAGNOSIS — E039 Hypothyroidism, unspecified: Secondary | ICD-10-CM

## 2017-06-27 DIAGNOSIS — G894 Chronic pain syndrome: Secondary | ICD-10-CM | POA: Diagnosis not present

## 2017-06-27 DIAGNOSIS — I10 Essential (primary) hypertension: Secondary | ICD-10-CM

## 2017-06-27 DIAGNOSIS — E538 Deficiency of other specified B group vitamins: Secondary | ICD-10-CM | POA: Diagnosis not present

## 2017-06-27 DIAGNOSIS — F419 Anxiety disorder, unspecified: Secondary | ICD-10-CM | POA: Diagnosis not present

## 2017-06-27 MED ORDER — COMBIVENT RESPIMAT 20-100 MCG/ACT IN AERS: 1.0000 | INHALATION_SPRAY | Freq: Four times a day (QID) | RESPIRATORY_TRACT | 11 refills | Status: DC | PRN

## 2017-06-27 MED ORDER — CYANOCOBALAMIN 1000 MCG/ML IJ SOLN: 1000.0000 ug | INTRAMUSCULAR | 11 refills | Status: DC

## 2017-06-27 MED ORDER — OXYCODONE HCL 30 MG PO TABS: 30.0000 mg | ORAL_TABLET | Freq: Four times a day (QID) | ORAL | 0 refills | Status: DC | PRN

## 2017-06-27 MED ORDER — OXYCODONE HCL 30 MG PO TABS
30.0000 mg | ORAL_TABLET | ORAL | 0 refills | Status: DC | PRN
Start: 2017-06-27 — End: 2017-09-19

## 2017-06-27 MED ORDER — ALPRAZOLAM 1 MG PO TABS: 1.0000 mg | ORAL_TABLET | Freq: Two times a day (BID) | ORAL | 5 refills | Status: DC | PRN

## 2017-06-27 MED ORDER — TOPIRAMATE 25 MG PO TABS: 25.0000 mg | ORAL_TABLET | Freq: Every day | ORAL | 5 refills | Status: DC

## 2017-06-27 MED ORDER — LEVOTHYROXINE SODIUM 75 MCG PO TABS: 75.0000 ug | ORAL_TABLET | Freq: Every morning | ORAL | 3 refills | Status: DC

## 2017-06-27 NOTE — Patient Instructions (Signed)
In a few days you may receive a survey in the mail or online from [employer] regarding your visit with us today. Please take a moment to fill this out. Your feedback is very important to our whole office. It can help us better understand your needs as well as improve your experience and satisfaction. Thank you for taking your time to complete it. We care about you.  Rayneisha Bouza, PA-C  

## 2017-06-27 NOTE — Progress Notes (Signed)
BP (!) 152/88   Pulse 84   Temp 97.6 F (36.4 C) (Oral)   Ht 6' (1.829 m)   Wt 262 lb (118.8 kg)   BMI 35.53 kg/m    Subjective:    Patient ID: Joseph Stafford, male    DOB: 1971-06-26, 46 y.o.   MRN: [id number]  HPI: Joseph Stafford is a 46 y.o. male presenting on 06/27/2017 for Follow-up and Hypertension  This patient comes in for periodic recheck on medications and conditions including DDD, Hypothyroidism, hypertension, B12 deficiency,anxiety. He reports overall doing well except a very hard time sleeping. Has tried multiple meds without relief. In the past had take alprazolam at bed with improvement. Otherwise he has no new complaints.   All medications are reviewed today. There are no reports of any problems with the medications. All of the medical conditions are reviewed and updated.  Lab work is reviewed and will be ordered as medically necessary. There are no new problems reported with today's visit.   Relevant past medical, surgical, family and social history reviewed and updated as indicated. Allergies and medications reviewed and updated.  Past Medical History:  Diagnosis Date  . Chronic back pain   . Hypertension   . Thyroid disease     Past Surgical History:  Procedure Laterality Date  . desmoid tumor removed      Review of Systems  Constitutional: Positive for fatigue. Negative for appetite change and fever.  HENT: Negative.   Eyes: Negative.  Negative for pain and visual disturbance.  Respiratory: Negative.  Negative for cough, chest tightness, shortness of breath and wheezing.   Cardiovascular: Negative.  Negative for chest pain, palpitations and leg swelling.  Gastrointestinal: Negative.  Negative for abdominal pain, diarrhea, nausea and vomiting.  Endocrine: Negative.   Genitourinary: Negative.   Musculoskeletal: Positive for arthralgias, back pain, gait problem, joint swelling and myalgias.  Skin: Negative.  Negative for color change and rash.    Neurological: Negative for weakness, numbness and headaches.  Psychiatric/Behavioral: Positive for sleep disturbance. The patient is nervous/anxious.     Allergies as of 06/27/2017      Reactions   Penicillins Nausea Only   Ivp Dye [iodinated Diagnostic Agents]       Medication List       Accurate as of 06/27/17  9:13 AM. Always use your most recent med list.          ALPRAZolam 1 MG tablet Commonly known as:  XANAX Take 1 tablet (1 mg total) by mouth 2 (two) times daily as needed for anxiety.   amLODipine 10 MG tablet Commonly known as:  NORVASC Take 10 mg by mouth daily.   COMBIVENT RESPIMAT 20-100 MCG/ACT Aers respimat Generic drug:  Ipratropium-Albuterol Inhale 1 puff into the lungs every 6 (six) hours as needed.   cyanocobalamin 1000 MCG/ML injection Commonly known as:  (VITAMIN B-12) Inject 1 mL (1,000 mcg total) into the skin every 30 (thirty) days.   FLUoxetine 20 MG capsule Commonly known as:  PROZAC Take 20 mg by mouth daily.   hydrochlorothiazide 25 MG tablet Commonly known as:  HYDRODIURIL Take 1 tablet by mouth every morning.   levothyroxine 75 MCG tablet Commonly known as:  SYNTHROID, LEVOTHROID Take 1 tablet (75 mcg total) by mouth every morning.   lisinopril 20 MG tablet Commonly known as:  PRINIVIL,ZESTRIL Take 1 tablet (20 mg total) by mouth daily.   oxycodone 30 MG immediate release tablet Commonly known as:  ROXICODONE Take 1  tablet (30 mg total) by mouth every 4 (four) hours as needed for pain.   oxycodone 30 MG immediate release tablet Commonly known as:  ROXICODONE Take 1 tablet (30 mg total) by mouth every 6 (six) hours as needed for pain.   oxycodone 30 MG immediate release tablet Commonly known as:  ROXICODONE Take 1 tablet (30 mg total) by mouth every 6 (six) hours as needed for pain.   tamsulosin 0.4 MG Caps capsule Commonly known as:  FLOMAX Take 0.4 mg by mouth daily.   topiramate 25 MG tablet Commonly known as:   TOPAMAX Take 1-4 tablets (25-100 mg total) by mouth daily.          Objective:    BP (!) 152/88   Pulse 84   Temp 97.6 F (36.4 C) (Oral)   Ht 6' (1.829 m)   Wt 262 lb (118.8 kg)   BMI 35.53 kg/m   Allergies  Allergen Reactions  . Penicillins Nausea Only  . Ivp Dye [Iodinated Diagnostic Agents]     Physical Exam  Constitutional: He appears well-developed and well-nourished. No distress.  HENT:  Head: Normocephalic and atraumatic.  Eyes: Pupils are equal, round, and reactive to light. Conjunctivae and EOM are normal.  Neck: Normal range of motion. Neck supple.  Cardiovascular: Normal rate, regular rhythm and normal heart sounds.   Pulmonary/Chest: Effort normal and breath sounds normal. No respiratory distress.  Abdominal: Soft. Bowel sounds are normal.  Musculoskeletal: Normal range of motion.       Lumbar back: He exhibits tenderness, pain and spasm.  Neurological: He has normal strength.  Reflex Scores:      Patellar reflexes are 1+ on the right side and 1+ on the left side. Skin: Skin is warm and dry.  Psychiatric: He has a normal mood and affect. His behavior is normal.  Nursing note and vitals reviewed.       Assessment & Plan:   1. DDD (degenerative disc disease), lumbar - oxycodone (ROXICODONE) 30 MG immediate release tablet; Take 1 tablet (30 mg total) by mouth every 4 (four) hours as needed for pain.  Dispense: 120 tablet; Refill: 0 - oxycodone (ROXICODONE) 30 MG immediate release tablet; Take 1 tablet (30 mg total) by mouth every 6 (six) hours as needed for pain.  Dispense: 120 tablet; Refill: 0 - oxycodone (ROXICODONE) 30 MG immediate release tablet; Take 1 tablet (30 mg total) by mouth every 6 (six) hours as needed for pain.  Dispense: 120 tablet; Refill: 0  2. Acquired hypothyroidism  3. Chronic pain syndrome  4. Essential hypertension  5. Vitamin B12 deficiency - cyanocobalamin (,VITAMIN B-12,) 1000 MCG/ML injection; Inject 1 mL (1,000 mcg  total) into the skin every 30 (thirty) days.  Dispense: 1 mL; Refill: 11  6. Anxiety - ALPRAZolam (XANAX) 1 MG tablet; Take 1 tablet (1 mg total) by mouth 2 (two) times daily as needed for anxiety.  Dispense: 30 tablet; Refill: 5   Current Outpatient Prescriptions:  .  amLODipine (NORVASC) 10 MG tablet, Take 10 mg by mouth daily. , Disp: , Rfl:  .  COMBIVENT RESPIMAT 20-100 MCG/ACT AERS respimat, Inhale 1 puff into the lungs every 6 (six) hours as needed., Disp: 4 g, Rfl: 11 .  cyanocobalamin (,VITAMIN B-12,) 1000 MCG/ML injection, Inject 1 mL (1,000 mcg total) into the skin every 30 (thirty) days., Disp: 1 mL, Rfl: 11 .  FLUoxetine (PROZAC) 20 MG capsule, Take 20 mg by mouth daily. , Disp: , Rfl:  .  hydrochlorothiazide (HYDRODIURIL) 25 MG tablet, Take 1 tablet by mouth every morning., Disp: , Rfl:  .  levothyroxine (SYNTHROID, LEVOTHROID) 75 MCG tablet, Take 1 tablet (75 mcg total) by mouth every morning., Disp: 90 tablet, Rfl: 3 .  lisinopril (PRINIVIL,ZESTRIL) 20 MG tablet, Take 1 tablet (20 mg total) by mouth daily., Disp: 90 tablet, Rfl: 3 .  oxycodone (ROXICODONE) 30 MG immediate release tablet, Take 1 tablet (30 mg total) by mouth every 4 (four) hours as needed for pain., Disp: 120 tablet, Rfl: 0 .  oxycodone (ROXICODONE) 30 MG immediate release tablet, Take 1 tablet (30 mg total) by mouth every 6 (six) hours as needed for pain., Disp: 120 tablet, Rfl: 0 .  oxycodone (ROXICODONE) 30 MG immediate release tablet, Take 1 tablet (30 mg total) by mouth every 6 (six) hours as needed for pain., Disp: 120 tablet, Rfl: 0 .  tamsulosin (FLOMAX) 0.4 MG CAPS capsule, Take 0.4 mg by mouth daily. , Disp: , Rfl:  .  ALPRAZolam (XANAX) 1 MG tablet, Take 1 tablet (1 mg total) by mouth 2 (two) times daily as needed for anxiety., Disp: 30 tablet, Rfl: 5 .  topiramate (TOPAMAX) 25 MG tablet, Take 1-4 tablets (25-100 mg total) by mouth daily., Disp: 120 tablet, Rfl: 5  Continue all other maintenance  medications as listed above.  Follow up plan: Return in about 3 months (around 09/27/2017) for recheck.  Educational handout given for survey   Remus LofflerAngel S. Parris Cudworth PA-C [facility] [address]  [place], [place] [id number] [phone]   06/27/2017, 9:13 AM

## 2017-08-31 ENCOUNTER — Telehealth: Payer: Self-pay | Admitting: Physician Assistant

## 2017-08-31 NOTE — Telephone Encounter (Signed)
Yes, that is okay.

## 2017-08-31 NOTE — Telephone Encounter (Signed)
Left detailed message for pt regarding appt.

## 2017-08-31 NOTE — Telephone Encounter (Signed)
Is this ok to schedule?

## 2017-09-19 ENCOUNTER — Ambulatory Visit (INDEPENDENT_AMBULATORY_CARE_PROVIDER_SITE_OTHER): Payer: BLUE CROSS/BLUE SHIELD | Admitting: Physician Assistant

## 2017-09-19 ENCOUNTER — Encounter: Payer: Self-pay | Admitting: Physician Assistant

## 2017-09-19 VITALS — BP 214/119 | HR 92 | Ht 72.0 in | Wt 268.0 lb

## 2017-09-19 DIAGNOSIS — I10 Essential (primary) hypertension: Secondary | ICD-10-CM | POA: Diagnosis not present

## 2017-09-19 DIAGNOSIS — M51369 Other intervertebral disc degeneration, lumbar region without mention of lumbar back pain or lower extremity pain: Secondary | ICD-10-CM

## 2017-09-19 DIAGNOSIS — Z23 Encounter for immunization: Secondary | ICD-10-CM | POA: Diagnosis not present

## 2017-09-19 DIAGNOSIS — E538 Deficiency of other specified B group vitamins: Secondary | ICD-10-CM | POA: Diagnosis not present

## 2017-09-19 DIAGNOSIS — M5136 Other intervertebral disc degeneration, lumbar region: Secondary | ICD-10-CM

## 2017-09-19 MED ORDER — CYANOCOBALAMIN 1000 MCG/ML IJ SOLN: 1000.0000 ug | Freq: Once | INTRAMUSCULAR | 11 refills | Status: AC

## 2017-09-19 MED ORDER — OXYCODONE HCL 30 MG PO TABS: 30.0000 mg | ORAL_TABLET | Freq: Four times a day (QID) | ORAL | 0 refills | Status: DC | PRN

## 2017-09-19 MED ORDER — OXYCODONE HCL 30 MG PO TABS: 30.0000 mg | ORAL_TABLET | ORAL | 0 refills | Status: DC | PRN

## 2017-09-19 NOTE — Progress Notes (Signed)
BP (!) 214/119   Pulse 92   Ht 6' (1.829 m)   Wt 268 lb (121.6 kg)   BMI 36.35 kg/m    Subjective:    Patient ID: YOUSSEF FOOTMAN, male    DOB: Aug 18, 1971, 46 y.o.   MRN: [id number]  HPI: Joseph Stafford is a 46 y.o. male presenting on 09/19/2017 for Medication Refill  Patient comes in today for recheck on his B12, degenerative disc disease and hypertension.  His blood pressure is quite elevated today.  He admits to missing his medication 2 or 3 times a week.  He works out of town and has a very irregular schedule.  I have encouraged him to try to get into a habit of taking his medication.  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  He is taking his vitamin B12 injection every 2 weeks.  The prescription I had written was for monthly.  Will make a change in this and send it to the pharmacy.  We have also refilled his oxycodone.  He takes 4 daily.  At this time it is doing well.  Relevant past medical, surgical, family and social history reviewed and updated as indicated. Allergies and medications reviewed and updated.  Past Medical History:  Diagnosis Date  . Chronic back pain   . Hypertension   . Thyroid disease     Past Surgical History:  Procedure Laterality Date  . desmoid tumor removed      Review of Systems  Constitutional: Positive for fatigue. Negative for appetite change.  HENT: Negative.   Eyes: Negative.  Negative for pain and visual disturbance.  Respiratory: Negative.  Negative for cough, chest tightness, shortness of breath and wheezing.   Cardiovascular: Negative.  Negative for chest pain, palpitations and leg swelling.  Gastrointestinal: Negative.  Negative for abdominal pain, diarrhea, nausea and vomiting.  Endocrine: Negative.   Genitourinary: Negative.   Musculoskeletal: Positive for arthralgias, back pain, myalgias and neck pain.  Skin: Negative.  Negative for color change and rash.  Neurological: Negative.  Negative for weakness, numbness  and headaches.  Psychiatric/Behavioral: Negative.     Allergies as of 09/19/2017      Reactions   Penicillins Nausea Only   Ivp Dye [iodinated Diagnostic Agents]       Medication List       Accurate as of 09/19/17  8:21 AM. Always use your most recent med list.          ALPRAZolam 1 MG tablet Commonly known as:  XANAX Take 1 tablet (1 mg total) by mouth 2 (two) times daily as needed for anxiety.   amLODipine 10 MG tablet Commonly known as:  NORVASC Take 10 mg by mouth daily.   COMBIVENT RESPIMAT 20-100 MCG/ACT Aers respimat Generic drug:  Ipratropium-Albuterol Inhale 1 puff into the lungs every 6 (six) hours as needed.   cyanocobalamin 1000 MCG/ML injection Commonly known as:  (VITAMIN B-12) Inject 1 mL (1,000 mcg total) into the skin once. Inject every 2 weeks   FLUoxetine 20 MG capsule Commonly known as:  PROZAC Take 20 mg by mouth daily.   hydrochlorothiazide 25 MG tablet Commonly known as:  HYDRODIURIL Take 1 tablet by mouth every morning.   levothyroxine 75 MCG tablet Commonly known as:  SYNTHROID, LEVOTHROID Take 1 tablet (75 mcg total) by mouth every morning.   lisinopril 20 MG tablet Commonly known as:  PRINIVIL,ZESTRIL Take 1 tablet (20 mg total) by mouth daily.   oxycodone  30 MG immediate release tablet Commonly known as:  ROXICODONE Take 1 tablet (30 mg total) by mouth every 4 (four) hours as needed for pain.   oxycodone 30 MG immediate release tablet Commonly known as:  ROXICODONE Take 1 tablet (30 mg total) by mouth every 6 (six) hours as needed for pain.   oxycodone 30 MG immediate release tablet Commonly known as:  ROXICODONE Take 1 tablet (30 mg total) by mouth every 6 (six) hours as needed for pain.   tamsulosin 0.4 MG Caps capsule Commonly known as:  FLOMAX Take 0.4 mg by mouth daily.   topiramate 25 MG tablet Commonly known as:  TOPAMAX Take 1-4 tablets (25-100 mg total) by mouth daily.          Objective:    BP (!)  214/119   Pulse 92   Ht 6' (1.829 m)   Wt 268 lb (121.6 kg)   BMI 36.35 kg/m   Allergies  Allergen Reactions  . Penicillins Nausea Only  . Ivp Dye [Iodinated Diagnostic Agents]     Physical Exam  Constitutional: He appears well-developed and well-nourished.  HENT:  Head: Normocephalic and atraumatic.  Eyes: Pupils are equal, round, and reactive to light. Conjunctivae and EOM are normal.  Neck: Normal range of motion. Neck supple.  Cardiovascular: Normal rate, regular rhythm and normal heart sounds.   Pulmonary/Chest: Effort normal and breath sounds normal.  Abdominal: Soft. Bowel sounds are normal.  Musculoskeletal:       Lumbar back: He exhibits decreased range of motion, edema, pain and spasm.  Neurological: No sensory deficit.  Skin: Skin is warm and dry.        Assessment & Plan:   1. Vitamin B12 deficiency - cyanocobalamin (,VITAMIN B-12,) 1000 MCG/ML injection; Inject 1 mL (1,000 mcg total) into the skin once. Inject every 2 weeks  Dispense: 2 mL; Refill: 11  2. DDD (degenerative disc disease), lumbar - oxycodone (ROXICODONE) 30 MG immediate release tablet; Take 1 tablet (30 mg total) by mouth every 4 (four) hours as needed for pain.  Dispense: 120 tablet; Refill: 0 - oxycodone (ROXICODONE) 30 MG immediate release tablet; Take 1 tablet (30 mg total) by mouth every 6 (six) hours as needed for pain.  Dispense: 120 tablet; Refill: 0 - oxycodone (ROXICODONE) 30 MG immediate release tablet; Take 1 tablet (30 mg total) by mouth every 6 (six) hours as needed for pain.  Dispense: 120 tablet; Refill: 0  3. Essential hypertension Due to missing the medication did not want to make a change in therapy.  Monitor blood pressure.  Call in the next 2 weeks if readings are still elevated.   Current Outpatient Prescriptions:  .  ALPRAZolam (XANAX) 1 MG tablet, Take 1 tablet (1 mg total) by mouth 2 (two) times daily as needed for anxiety., Disp: 30 tablet, Rfl: 5 .  amLODipine  (NORVASC) 10 MG tablet, Take 10 mg by mouth daily. , Disp: , Rfl:  .  COMBIVENT RESPIMAT 20-100 MCG/ACT AERS respimat, Inhale 1 puff into the lungs every 6 (six) hours as needed., Disp: 4 g, Rfl: 11 .  cyanocobalamin (,VITAMIN B-12,) 1000 MCG/ML injection, Inject 1 mL (1,000 mcg total) into the skin once. Inject every 2 weeks, Disp: 2 mL, Rfl: 11 .  FLUoxetine (PROZAC) 20 MG capsule, Take 20 mg by mouth daily. , Disp: , Rfl:  .  hydrochlorothiazide (HYDRODIURIL) 25 MG tablet, Take 1 tablet by mouth every morning., Disp: , Rfl:  .  levothyroxine (SYNTHROID, LEVOTHROID)  75 MCG tablet, Take 1 tablet (75 mcg total) by mouth every morning., Disp: 90 tablet, Rfl: 3 .  lisinopril (PRINIVIL,ZESTRIL) 20 MG tablet, Take 1 tablet (20 mg total) by mouth daily., Disp: 90 tablet, Rfl: 3 .  oxycodone (ROXICODONE) 30 MG immediate release tablet, Take 1 tablet (30 mg total) by mouth every 4 (four) hours as needed for pain., Disp: 120 tablet, Rfl: 0 .  oxycodone (ROXICODONE) 30 MG immediate release tablet, Take 1 tablet (30 mg total) by mouth every 6 (six) hours as needed for pain., Disp: 120 tablet, Rfl: 0 .  oxycodone (ROXICODONE) 30 MG immediate release tablet, Take 1 tablet (30 mg total) by mouth every 6 (six) hours as needed for pain., Disp: 120 tablet, Rfl: 0 .  tamsulosin (FLOMAX) 0.4 MG CAPS capsule, Take 0.4 mg by mouth daily. , Disp: , Rfl:  .  topiramate (TOPAMAX) 25 MG tablet, Take 1-4 tablets (25-100 mg total) by mouth daily., Disp: 120 tablet, Rfl: 5 Continue all other maintenance medications as listed above.  Follow up plan: Return in about 3 months (around 12/20/2017) for recheck.  Educational handout given for survey  Remus Loffler PA-C [facility] [address]  [place], [place] [address] [phone]   09/19/2017, 8:21 AM

## 2017-09-19 NOTE — Patient Instructions (Signed)
In a few days you may receive a survey in the mail or online from [employer] regarding your visit with us today. Please take a moment to fill this out. Your feedback is very important to our whole office. It can help us better understand your needs as well as improve your experience and satisfaction. Thank you for taking your time to complete it. We care about you.  Lorriann Hansmann, PA-C  

## 2017-10-11 ENCOUNTER — Other Ambulatory Visit: Payer: Self-pay | Admitting: Family Medicine

## 2017-10-11 DIAGNOSIS — F419 Anxiety disorder, unspecified: Secondary | ICD-10-CM

## 2017-10-11 NOTE — Telephone Encounter (Signed)
Script was done 06/27/17 and had 5 refills

## 2017-10-11 NOTE — Telephone Encounter (Signed)
Last seen 09/19/17  If approved route to nurse to call into [facility]

## 2017-10-11 NOTE — Telephone Encounter (Signed)
I did it on 06/27/17 with 5 refills, why is a refill needed?

## 2017-10-24 ENCOUNTER — Other Ambulatory Visit: Payer: Self-pay | Admitting: Family Medicine

## 2017-10-24 DIAGNOSIS — F419 Anxiety disorder, unspecified: Secondary | ICD-10-CM

## 2017-10-25 NOTE — Telephone Encounter (Signed)
rx called into pharmacy

## 2017-10-25 NOTE — Telephone Encounter (Signed)
[facility] seen 09/19/17 Dr Laroy AppleBradshaw   [facility]  I f approved route to nurse to call into [facility]

## 2017-12-01 ENCOUNTER — Ambulatory Visit: Payer: BLUE CROSS/BLUE SHIELD | Admitting: Physician Assistant

## 2017-12-01 ENCOUNTER — Encounter: Payer: Self-pay | Admitting: Physician Assistant

## 2017-12-01 VITALS — BP 153/93 | HR 97 | Temp 98.0°F | Ht 72.0 in | Wt 258.6 lb

## 2017-12-01 DIAGNOSIS — M5136 Other intervertebral disc degeneration, lumbar region: Secondary | ICD-10-CM | POA: Diagnosis not present

## 2017-12-01 DIAGNOSIS — Z720 Tobacco use: Secondary | ICD-10-CM

## 2017-12-01 DIAGNOSIS — N200 Calculus of kidney: Secondary | ICD-10-CM | POA: Diagnosis not present

## 2017-12-01 MED ORDER — OXYCODONE HCL 30 MG PO TABS: 30.0000 mg | ORAL_TABLET | Freq: Four times a day (QID) | ORAL | 0 refills | Status: DC | PRN

## 2017-12-01 MED ORDER — HYDROMORPHONE HCL 4 MG PO TABS: 4.0000 mg | ORAL_TABLET | Freq: Four times a day (QID) | ORAL | 0 refills | Status: DC | PRN

## 2017-12-01 MED ORDER — OXYCODONE HCL 30 MG PO TABS
30.0000 mg | ORAL_TABLET | ORAL | 0 refills | Status: DC | PRN
Start: 2017-12-01 — End: 2018-01-26

## 2017-12-01 NOTE — Patient Instructions (Signed)
In a few days you may receive a survey in the mail or online from [employer] regarding your visit with us today. Please take a moment to fill this out. Your feedback is very important to our whole office. It can help us better understand your needs as well as improve your experience and satisfaction. Thank you for taking your time to complete it. We care about you.  Shanyiah Conde, PA-C  

## 2017-12-01 NOTE — Progress Notes (Signed)
BP (!) 153/93   Pulse 97   Temp 98 F (36.7 C) (Oral)   Ht 6' (1.829 m)   Wt 258 lb 9.6 oz (117.3 kg)   BMI 35.07 kg/m    Subjective:    Patient ID: Joseph Stafford, male    DOB: October 14, 1971, 47 y.o.   MRN: [id number]  HPI: Joseph Stafford is a 47 y.o. male presenting on 12/01/2017 for Follow-up (3 month ); Hypertension; and Hypothyroidism  Chronic pain is under control, patient has no new complaints. Medications are keeping things stable. Needs refills for the next three months.  [place] Controlled Substance website checked and normal. Drug screen normal this year. Patient also with a positive history of kidney stones.  In the past he has had a small amount of Dilaudid to use for these severe attacks.  He does work out of town.  Starting next week he will be working in [place]. Most recent stone was 2 weeks ago over Christmas.  Relevant past medical, surgical, family and social history reviewed and updated as indicated. Allergies and medications reviewed and updated.  Past Medical History:  Diagnosis Date  . Chronic back pain   . Hypertension   . Thyroid disease     Past Surgical History:  Procedure Laterality Date  . desmoid tumor removed      Review of Systems  Constitutional: Negative.  Negative for appetite change and fatigue.  HENT: Negative.   Eyes: Negative.  Negative for pain and visual disturbance.  Respiratory: Negative.  Negative for cough, chest tightness, shortness of breath and wheezing.   Cardiovascular: Negative.  Negative for chest pain, palpitations and leg swelling.  Gastrointestinal: Negative.  Negative for abdominal pain, diarrhea, nausea and vomiting.  Endocrine: Negative.   Genitourinary: Positive for flank pain.  Musculoskeletal: Positive for arthralgias, back pain, joint swelling, myalgias and neck pain.  Skin: Negative.  Negative for color change and rash.  Neurological: Negative.  Negative for weakness, numbness and headaches.    Psychiatric/Behavioral: Negative.     Allergies as of 12/01/2017      Reactions   Penicillins Nausea Only   Ivp Dye [iodinated Diagnostic Agents]       Medication List        Accurate as of 12/01/17  5:03 PM. Always use your most recent med list.          ALPRAZolam 1 MG tablet Commonly known as:  XANAX TAKE 1 TABLET BY MOUTH TWICE A DAY AS NEEDED FOR ANXIETY   amLODipine 10 MG tablet Commonly known as:  NORVASC Take 10 mg by mouth daily.   COMBIVENT RESPIMAT 20-100 MCG/ACT Aers respimat Generic drug:  Ipratropium-Albuterol Inhale 1 puff into the lungs every 6 (six) hours as needed.   FLUoxetine 20 MG capsule Commonly known as:  PROZAC Take 20 mg by mouth daily.   hydrochlorothiazide 25 MG tablet Commonly known as:  HYDRODIURIL Take 1 tablet by mouth every morning.   HYDROmorphone 4 MG tablet Commonly known as:  DILAUDID Take 1 tablet (4 mg total) by mouth every 6 (six) hours as needed for severe pain (kidney stones).   levothyroxine 75 MCG tablet Commonly known as:  SYNTHROID, LEVOTHROID Take 1 tablet (75 mcg total) by mouth every morning.   lisinopril 20 MG tablet Commonly known as:  PRINIVIL,ZESTRIL Take 1 tablet (20 mg total) by mouth daily.   oxycodone 30 MG immediate release tablet Commonly known as:  ROXICODONE Take 1 tablet (30 mg total) by mouth  every 4 (four) hours as needed for pain.   oxycodone 30 MG immediate release tablet Commonly known as:  ROXICODONE Take 1 tablet (30 mg total) by mouth every 6 (six) hours as needed for pain.   oxycodone 30 MG immediate release tablet Commonly known as:  ROXICODONE Take 1 tablet (30 mg total) by mouth every 4 (four) hours as needed for pain.   tamsulosin 0.4 MG Caps capsule Commonly known as:  FLOMAX Take 0.4 mg by mouth daily.   topiramate 25 MG tablet Commonly known as:  TOPAMAX Take 1-4 tablets (25-100 mg total) by mouth daily.          Objective:    BP (!) 153/93   Pulse 97   Temp 98 F  (36.7 C) (Oral)   Ht 6' (1.829 m)   Wt 258 lb 9.6 oz (117.3 kg)   BMI 35.07 kg/m   Allergies  Allergen Reactions  . Penicillins Nausea Only  . Ivp Dye [Iodinated Diagnostic Agents]     Physical Exam  Constitutional: He appears well-developed and well-nourished. No distress.  HENT:  Head: Normocephalic and atraumatic.  Eyes: Conjunctivae and EOM are normal. Pupils are equal, round, and reactive to light.  Cardiovascular: Normal rate, regular rhythm and normal heart sounds.  Pulmonary/Chest: Effort normal and breath sounds normal. No respiratory distress.  Skin: Skin is warm and dry.  Psychiatric: He has a normal mood and affect. His behavior is normal.  Nursing note and vitals reviewed.       Assessment & Plan:   1. DDD (degenerative disc disease), lumbar - oxycodone (ROXICODONE) 30 MG immediate release tablet; Take 1 tablet (30 mg total) by mouth every 4 (four) hours as needed for pain.  Dispense: 150 tablet; Refill: 0 - oxycodone (ROXICODONE) 30 MG immediate release tablet; Take 1 tablet (30 mg total) by mouth every 6 (six) hours as needed for pain.  Dispense: 150 tablet; Refill: 0 - oxycodone (ROXICODONE) 30 MG immediate release tablet; Take 1 tablet (30 mg total) by mouth every 4 (four) hours as needed for pain.  Dispense: 150 tablet; Refill: 0  2. Smoking trying to quit  3. Kidney stones Use very carefully for stone episodes - HYDROmorphone (DILAUDID) 4 MG tablet; Take 1 tablet (4 mg total) by mouth every 6 (six) hours as needed for severe pain (kidney stones).  Dispense: 30 tablet; Refill: 0    Current Outpatient Medications:  .  ALPRAZolam (XANAX) 1 MG tablet, TAKE 1 TABLET BY MOUTH TWICE A DAY AS NEEDED FOR ANXIETY, Disp: 30 tablet, Rfl: 5 .  amLODipine (NORVASC) 10 MG tablet, Take 10 mg by mouth daily. , Disp: , Rfl:  .  COMBIVENT RESPIMAT 20-100 MCG/ACT AERS respimat, Inhale 1 puff into the lungs every 6 (six) hours as needed., Disp: 4 g, Rfl: 11 .  FLUoxetine  (PROZAC) 20 MG capsule, Take 20 mg by mouth daily. , Disp: , Rfl:  .  hydrochlorothiazide (HYDRODIURIL) 25 MG tablet, Take 1 tablet by mouth every morning., Disp: , Rfl:  .  HYDROmorphone (DILAUDID) 4 MG tablet, Take 1 tablet (4 mg total) by mouth every 6 (six) hours as needed for severe pain (kidney stones)., Disp: 30 tablet, Rfl: 0 .  levothyroxine (SYNTHROID, LEVOTHROID) 75 MCG tablet, Take 1 tablet (75 mcg total) by mouth every morning., Disp: 90 tablet, Rfl: 3 .  lisinopril (PRINIVIL,ZESTRIL) 20 MG tablet, Take 1 tablet (20 mg total) by mouth daily., Disp: 90 tablet, Rfl: 3 .  oxycodone (ROXICODONE) 30  MG immediate release tablet, Take 1 tablet (30 mg total) by mouth every 4 (four) hours as needed for pain., Disp: 150 tablet, Rfl: 0 .  oxycodone (ROXICODONE) 30 MG immediate release tablet, Take 1 tablet (30 mg total) by mouth every 6 (six) hours as needed for pain., Disp: 150 tablet, Rfl: 0 .  oxycodone (ROXICODONE) 30 MG immediate release tablet, Take 1 tablet (30 mg total) by mouth every 4 (four) hours as needed for pain., Disp: 150 tablet, Rfl: 0 .  tamsulosin (FLOMAX) 0.4 MG CAPS capsule, Take 0.4 mg by mouth daily. , Disp: , Rfl:  .  topiramate (TOPAMAX) 25 MG tablet, Take 1-4 tablets (25-100 mg total) by mouth daily., Disp: 120 tablet, Rfl: 5 Continue all other maintenance medications as listed above.  Follow up plan: Recheck 3 months  Educational handout given for survey   Remus Loffler PA-C [facility] [address]  [place], [place] [address] [phone]   12/01/2017, 5:03 PM

## 2018-01-26 ENCOUNTER — Ambulatory Visit: Payer: BLUE CROSS/BLUE SHIELD | Admitting: Physician Assistant

## 2018-01-26 ENCOUNTER — Encounter: Payer: Self-pay | Admitting: Physician Assistant

## 2018-01-26 VITALS — BP 153/87 | HR 93 | Temp 96.8°F | Ht 72.0 in | Wt 245.2 lb

## 2018-01-26 DIAGNOSIS — N200 Calculus of kidney: Secondary | ICD-10-CM

## 2018-01-26 DIAGNOSIS — I10 Essential (primary) hypertension: Secondary | ICD-10-CM | POA: Diagnosis not present

## 2018-01-26 DIAGNOSIS — M5136 Other intervertebral disc degeneration, lumbar region: Secondary | ICD-10-CM

## 2018-01-26 MED ORDER — OXYCODONE HCL 30 MG PO TABS: 30.0000 mg | ORAL_TABLET | ORAL | 0 refills | Status: DC | PRN

## 2018-01-26 MED ORDER — OXYCODONE HCL 30 MG PO TABS: 30.0000 mg | ORAL_TABLET | Freq: Four times a day (QID) | ORAL | 0 refills | Status: DC | PRN

## 2018-01-26 MED ORDER — HYDROMORPHONE HCL 4 MG PO TABS: 4.0000 mg | ORAL_TABLET | Freq: Four times a day (QID) | ORAL | 0 refills | Status: DC | PRN

## 2018-01-26 NOTE — Patient Instructions (Addendum)
In a few days you may receive a survey in the mail or online from [employer] regarding your visit with us today. Please take a moment to fill this out. Your feedback is very important to our whole office. It can help us better understand your needs as well as improve your experience and satisfaction. Thank you for taking your time to complete it. We care about you.  Aleane Wesenberg, PA-C  

## 2018-01-26 NOTE — Progress Notes (Signed)
BP (!) 153/87   Pulse 93   Temp (!) 96.8 F (36 C) (Oral)   Ht 6' (1.829 m)   Wt 245 lb 3.2 oz (111.2 kg)   BMI 33.26 kg/m    Subjective:    Patient ID: Joseph Stafford, male    DOB: 08/10/1971, 47 y.o.   MRN: [id number]  HPI: Joseph Stafford is a 47 y.o. male presenting on 01/26/2018 for Follow-up (medication refill )  This patient comes in for periodic recheck on medications and conditions including DDD, kidney stones, hypertension.  He has been off for a couple of months due to his father having a major fracture.  However he will be leaving tomorrow to go back for about 4 months.  He is also having a tremendous amount of kidney stone issues.  We have discussed getting a urology consult however he has not been to be entailed any predictable time.  Of asked him to let us know if and when he is going to be available.  All medications are reviewed today. There are no reports of any problems with the medications. All of the medical conditions are reviewed and updated.  Lab work is reviewed and will be ordered as medically necessary. There are no new problems reported with today's visit.   Past Medical History:  Diagnosis Date  . Chronic back pain   . Hypertension   . Thyroid disease    Relevant past medical, surgical, family and social history reviewed and updated as indicated. Interim medical history since our last visit reviewed. Allergies and medications reviewed and updated. DATA REVIEWED: CHART IN EPIC  Family History reviewed for pertinent findings.  Review of Systems  Constitutional: Negative.  Negative for appetite change and fatigue.  Eyes: Negative for pain and visual disturbance.  Respiratory: Negative.  Negative for cough, chest tightness, shortness of breath and wheezing.   Cardiovascular: Negative.  Negative for chest pain, palpitations and leg swelling.  Gastrointestinal: Negative.  Negative for abdominal pain, diarrhea, nausea and vomiting.  Genitourinary:  Positive for flank pain and frequency.  Musculoskeletal: Positive for arthralgias, back pain, gait problem and myalgias.  Skin: Negative.  Negative for color change and rash.  Neurological: Negative for weakness, numbness and headaches.  Psychiatric/Behavioral: Negative.     Allergies as of 01/26/2018      Reactions   Penicillins Nausea Only   Ivp Dye [iodinated Diagnostic Agents]       Medication List        Accurate as of 01/26/18  2:12 PM. Always use your most recent med list.          ALPRAZolam 1 MG tablet Commonly known as:  XANAX TAKE 1 TABLET BY MOUTH TWICE A DAY AS NEEDED FOR ANXIETY   amLODipine 10 MG tablet Commonly known as:  NORVASC Take 10 mg by mouth daily.   COMBIVENT RESPIMAT 20-100 MCG/ACT Aers respimat Generic drug:  Ipratropium-Albuterol Inhale 1 puff into the lungs every 6 (six) hours as needed.   FLUoxetine 20 MG capsule Commonly known as:  PROZAC Take 20 mg by mouth daily.   hydrochlorothiazide 25 MG tablet Commonly known as:  HYDRODIURIL Take 1 tablet by mouth every morning.   HYDROmorphone 4 MG tablet Commonly known as:  DILAUDID Take 1 tablet (4 mg total) by mouth every 6 (six) hours as needed for severe pain (kidney stones).   levothyroxine 75 MCG tablet Commonly known as:  SYNTHROID, LEVOTHROID Take 1 tablet (75 mcg total) by mouth every  morning.   lisinopril 20 MG tablet Commonly known as:  PRINIVIL,ZESTRIL Take 1 tablet (20 mg total) by mouth daily.   oxycodone 30 MG immediate release tablet Commonly known as:  ROXICODONE Take 1 tablet (30 mg total) by mouth every 4 (four) hours as needed for pain.   oxycodone 30 MG immediate release tablet Commonly known as:  ROXICODONE Take 1 tablet (30 mg total) by mouth every 6 (six) hours as needed for pain.   oxycodone 30 MG immediate release tablet Commonly known as:  ROXICODONE Take 1 tablet (30 mg total) by mouth every 4 (four) hours as needed for pain.   tamsulosin 0.4 MG Caps  capsule Commonly known as:  FLOMAX Take 0.4 mg by mouth daily.   topiramate 25 MG tablet Commonly known as:  TOPAMAX Take 1-4 tablets (25-100 mg total) by mouth daily.          Objective:    BP (!) 153/87   Pulse 93   Temp (!) 96.8 F (36 C) (Oral)   Ht 6' (1.829 m)   Wt 245 lb 3.2 oz (111.2 kg)   BMI 33.26 kg/m   Allergies  Allergen Reactions  . Penicillins Nausea Only  . Ivp Dye [Iodinated Diagnostic Agents]     Wt Readings from Last 3 Encounters:  01/26/18 245 lb 3.2 oz (111.2 kg)  12/01/17 258 lb 9.6 oz (117.3 kg)  09/19/17 268 lb (121.6 kg)    Physical Exam  Constitutional: He appears well-developed and well-nourished. No distress.  HENT:  Head: Normocephalic and atraumatic.  Eyes: Conjunctivae and EOM are normal. Pupils are equal, round, and reactive to light.  Cardiovascular: Normal rate, regular rhythm and normal heart sounds.  Pulmonary/Chest: Effort normal and breath sounds normal. No respiratory distress.  Musculoskeletal:       Lumbar back: He exhibits decreased range of motion, tenderness, pain and spasm.  Neurological:  Reflex Scores:      Patellar reflexes are 1+ on the right side and 3+ on the left side. Skin: Skin is warm and dry.  Psychiatric: He has a normal mood and affect. His behavior is normal.  Nursing note and vitals reviewed.       Assessment & Plan:   1. DDD (degenerative disc disease), lumbar - oxycodone (ROXICODONE) 30 MG immediate release tablet; Take 1 tablet (30 mg total) by mouth every 4 (four) hours as needed for pain.  Dispense: 150 tablet; Refill: 0 - oxycodone (ROXICODONE) 30 MG immediate release tablet; Take 1 tablet (30 mg total) by mouth every 6 (six) hours as needed for pain.  Dispense: 150 tablet; Refill: 0 - oxycodone (ROXICODONE) 30 MG immediate release tablet; Take 1 tablet (30 mg total) by mouth every 4 (four) hours as needed for pain.  Dispense: 150 tablet; Refill: 0  2. Kidney stones - HYDROmorphone (DILAUDID)  4 MG tablet; Take 1 tablet (4 mg total) by mouth every 6 (six) hours as needed for severe pain (kidney stones).  Dispense: 30 tablet; Refill: 0  3. Essential hypertension   Continue all other maintenance medications as listed above.  Follow up plan: Return in about 4 months (around 05/26/2018) for recheck.  Educational handout given for survey  Remus LofflerAngel S. Keval Nam PA-C [facility] [address]  [place], [place] [id number] [phone]   01/26/2018, 2:12 PM

## 2018-03-23 ENCOUNTER — Other Ambulatory Visit: Payer: Self-pay | Admitting: Physician Assistant

## 2018-03-23 DIAGNOSIS — F419 Anxiety disorder, unspecified: Secondary | ICD-10-CM

## 2018-04-17 ENCOUNTER — Encounter: Payer: Self-pay | Admitting: Physician Assistant

## 2018-04-17 ENCOUNTER — Ambulatory Visit (INDEPENDENT_AMBULATORY_CARE_PROVIDER_SITE_OTHER): Payer: Self-pay | Admitting: Physician Assistant

## 2018-04-17 DIAGNOSIS — M5136 Other intervertebral disc degeneration, lumbar region: Secondary | ICD-10-CM

## 2018-04-17 DIAGNOSIS — N200 Calculus of kidney: Secondary | ICD-10-CM

## 2018-04-17 DIAGNOSIS — F419 Anxiety disorder, unspecified: Secondary | ICD-10-CM

## 2018-04-17 MED ORDER — ALPRAZOLAM 0.5 MG PO TABS: 0.5000 mg | ORAL_TABLET | Freq: Two times a day (BID) | ORAL | 5 refills | Status: DC

## 2018-04-17 MED ORDER — OXYCODONE HCL 30 MG PO TABS: 30.0000 mg | ORAL_TABLET | Freq: Four times a day (QID) | ORAL | 0 refills | Status: DC | PRN

## 2018-04-17 MED ORDER — HYDROMORPHONE HCL 4 MG PO TABS: 4.0000 mg | ORAL_TABLET | Freq: Four times a day (QID) | ORAL | 0 refills | Status: DC | PRN

## 2018-04-17 MED ORDER — OXYCODONE HCL 30 MG PO TABS: 30.0000 mg | ORAL_TABLET | ORAL | 0 refills | Status: DC | PRN

## 2018-04-17 MED ORDER — OXYCODONE HCL 30 MG PO TABS
30.0000 mg | ORAL_TABLET | ORAL | 0 refills | Status: DC | PRN
Start: 2018-04-17 — End: 2018-07-19

## 2018-04-17 NOTE — Progress Notes (Signed)
BP (!) 188/106   Pulse (!) 116   Temp 98.3 F (36.8 C) (Oral)   Ht 6' (1.829 m)   Wt 241 lb 12.8 oz (109.7 kg)   BMI 32.79 kg/m    Subjective:    Patient ID: Joseph Stafford, male    DOB: 03/16/1971, 47 y.o.   MRN: [id number]  HPI: Joseph Stafford is a 47 y.o. male presenting on 04/17/2018 for Hypertension; Degenerative Disc Disease ; Pain; and Hypothyroidism  This patient comes in for 15-month recheck on his medication.  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  His pain is much worse.  We have agreed that he will reduce his Xanax strength and we can adjust the oxycodone strength.  He denies any fever or chills at this time.  No nausea vomiting or diarrhea.  He is tolerating all his medications.  His blood pressure readings at home have been 140-150 over 80s.  I have asked him to keep a log of his blood pressures.   Past Medical History:  Diagnosis Date  . Chronic back pain   . Hypertension   . Thyroid disease    Relevant past medical, surgical, family and social history reviewed and updated as indicated. Interim medical history since our last visit reviewed. Allergies and medications reviewed and updated. DATA REVIEWED: CHART IN EPIC  Family History reviewed for pertinent findings.  Review of Systems  Constitutional: Negative.  Negative for appetite change and fatigue.  HENT: Negative.   Eyes: Negative.  Negative for pain and visual disturbance.  Respiratory: Negative.  Negative for cough, chest tightness, shortness of breath and wheezing.   Cardiovascular: Negative.  Negative for chest pain, palpitations and leg swelling.  Gastrointestinal: Negative.  Negative for abdominal pain, diarrhea, nausea and vomiting.  Endocrine: Negative.   Genitourinary: Negative.   Musculoskeletal: Positive for arthralgias, back pain, gait problem, joint swelling and myalgias.  Skin: Negative.  Negative  for color change and rash.  Neurological: Negative for weakness, numbness and headaches.  Psychiatric/Behavioral: Negative.     Allergies as of 04/17/2018      Reactions   Penicillins Nausea Only   Ivp Dye [iodinated Diagnostic Agents]       Medication List        Accurate as of 04/17/18 12:33 PM. Always use your most recent med list.          ALPRAZolam 0.5 MG tablet Commonly known as:  XANAX Take 1 tablet (0.5 mg total) by mouth 2 (two) times daily.   amLODipine 10 MG tablet Commonly known as:  NORVASC Take 10 mg by mouth daily.   COMBIVENT RESPIMAT 20-100 MCG/ACT Aers respimat Generic drug:  Ipratropium-Albuterol Inhale 1 puff into the lungs every 6 (six) hours as needed.   FLUoxetine 20 MG capsule Commonly known as:  PROZAC Take 20 mg by mouth daily.   hydrochlorothiazide 25 MG tablet Commonly known as:  HYDRODIURIL Take 1 tablet by mouth every morning.   HYDROmorphone 4 MG tablet Commonly known as:  DILAUDID Take 1 tablet (4 mg total) by mouth every 6 (six) hours as needed for severe pain (kidney stones).   levothyroxine 75 MCG tablet Commonly known as:  SYNTHROID, LEVOTHROID Take 1 tablet (75 mcg total) by mouth every morning.   lisinopril 20 MG tablet Commonly known as:  PRINIVIL,ZESTRIL Take 1 tablet (20 mg total) by mouth daily.  oxycodone 30 MG immediate release tablet Commonly known as:  ROXICODONE Take 1 tablet (30 mg total) by mouth every 4 (four) hours as needed for pain.   oxycodone 30 MG immediate release tablet Commonly known as:  ROXICODONE Take 1 tablet (30 mg total) by mouth every 6 (six) hours as needed for pain.   oxycodone 30 MG immediate release tablet Commonly known as:  ROXICODONE Take 1 tablet (30 mg total) by mouth every 4 (four) hours as needed for pain.   tamsulosin 0.4 MG Caps capsule Commonly known as:  FLOMAX Take 0.4 mg by mouth daily.   topiramate 25 MG tablet Commonly known as:  TOPAMAX Take 1-4 tablets (25-100 mg  total) by mouth daily.          Objective:    BP (!) 188/106   Pulse (!) 116   Temp 98.3 F (36.8 C) (Oral)   Ht 6' (1.829 m)   Wt 241 lb 12.8 oz (109.7 kg)   BMI 32.79 kg/m   Allergies  Allergen Reactions  . Penicillins Nausea Only  . Ivp Dye [Iodinated Diagnostic Agents]     Wt Readings from Last 3 Encounters:  04/17/18 241 lb 12.8 oz (109.7 kg)  01/26/18 245 lb 3.2 oz (111.2 kg)  12/01/17 258 lb 9.6 oz (117.3 kg)    Physical Exam  Constitutional: He appears well-developed and well-nourished. No distress.  HENT:  Head: Normocephalic and atraumatic.  Eyes: Pupils are equal, round, and reactive to light. Conjunctivae and EOM are normal.  Cardiovascular: Normal rate, regular rhythm and normal heart sounds.  Pulmonary/Chest: Effort normal and breath sounds normal. No respiratory distress.  Musculoskeletal:       Lumbar back: He exhibits decreased range of motion, pain and spasm.  Skin: Skin is warm and dry.  Psychiatric: He has a normal mood and affect. His behavior is normal.  Nursing note and vitals reviewed.       Assessment & Plan:   1. DDD (degenerative disc disease), lumbar - oxycodone (ROXICODONE) 30 MG immediate release tablet; Take 1 tablet (30 mg total) by mouth every 4 (four) hours as needed for pain.  Dispense: 180 tablet; Refill: 0 - oxycodone (ROXICODONE) 30 MG immediate release tablet; Take 1 tablet (30 mg total) by mouth every 6 (six) hours as needed for pain.  Dispense: 180 tablet; Refill: 0 - oxycodone (ROXICODONE) 30 MG immediate release tablet; Take 1 tablet (30 mg total) by mouth every 4 (four) hours as needed for pain.  Dispense: 180 tablet; Refill: 0  2. Anxiety - ALPRAZolam (XANAX) 0.5 MG tablet; Take 1 tablet (0.5 mg total) by mouth 2 (two) times daily.  Dispense: 60 tablet; Refill: 5  3. Kidney stones - HYDROmorphone (DILAUDID) 4 MG tablet; Take 1 tablet (4 mg total) by mouth every 6 (six) hours as needed for severe pain (kidney stones).   Dispense: 30 tablet; Refill: 0   Continue all other maintenance medications as listed above.  Follow up plan: Return in about 3 months (around 07/18/2018).  Educational handout given for survey  Remus Loffler PA-C [facility] [address]  [place], [place] [address] [phone]   04/17/2018, 12:33 PM

## 2018-04-28 ENCOUNTER — Encounter (HOSPITAL_COMMUNITY): Payer: Self-pay | Admitting: Emergency Medicine

## 2018-04-28 ENCOUNTER — Emergency Department (HOSPITAL_COMMUNITY)
Admission: EM | Admit: 2018-04-28 | Discharge: 2018-04-28 | Disposition: A | Discharge status: Home / Self Care | Payer: BLUE CROSS/BLUE SHIELD | Attending: Emergency Medicine | Admitting: Emergency Medicine

## 2018-04-28 ENCOUNTER — Other Ambulatory Visit: Payer: Self-pay

## 2018-04-28 DIAGNOSIS — Z79899 Other long term (current) drug therapy: Secondary | ICD-10-CM | POA: Insufficient documentation

## 2018-04-28 DIAGNOSIS — Z590 Homelessness: Secondary | ICD-10-CM | POA: Insufficient documentation

## 2018-04-28 DIAGNOSIS — I10 Essential (primary) hypertension: Secondary | ICD-10-CM | POA: Insufficient documentation

## 2018-04-28 DIAGNOSIS — F17200 Nicotine dependence, unspecified, uncomplicated: Secondary | ICD-10-CM | POA: Insufficient documentation

## 2018-04-28 DIAGNOSIS — T401X1A Poisoning by heroin, accidental (unintentional), initial encounter: Secondary | ICD-10-CM

## 2018-04-28 NOTE — Discharge Instructions (Addendum)
You were seen today after an overdose of heroin.  Heroin can kill you.  You should not use illicit drugs.

## 2018-04-28 NOTE — ED Provider Notes (Signed)
[facility] EMERGENCY DEPARTMENT Provider Note   CSN: [id number] Arrival date & time: 04/28/18  2130     History   Chief Complaint Chief Complaint  Patient presents with  . Drug Overdose    HPI Joseph Stafford is a 47 y.o. male.  HPI  This is a 47 year old male with history of chronic back pain on chronic narcotics, hypertension, thyroid disease who presents after an overdose.  Patient reports that he injected heroin.  He states "that is the last thing I remember."  EMS was called and he received 2 doses of Narcan after being found apneic and unresponsive.  He currently is without complaint.  He reports that he only started using injectable heroin over the last 2 weeks.  He does use chronic oral opiates which are prescribed to him.  He states he is currently homeless and "this guy just offered it to me."  He denies any intention to hurt himself.  He states "that was stupid."  He denies any chest pain, shortness of breath, abdominal pain, nausea, vomiting.  Past Medical History:  Diagnosis Date  . Chronic back pain   . Hypertension   . Thyroid disease     Patient Active Problem List   Diagnosis Date Noted  . Vitamin B12 deficiency 06/27/2017  . History of benign thyroid tumor 03/28/2017  . Acquired hypothyroidism 03/28/2017  . Chronic pain syndrome 03/28/2017  . DDD (degenerative disc disease), lumbar 03/28/2017  . Essential hypertension 03/28/2017    Past Surgical History:  Procedure Laterality Date  . desmoid tumor removed          Home Medications    Prior to Admission medications   Medication Sig Start Date End Date Taking? Authorizing Provider  ALPRAZolam Prudy Feeler) 0.5 MG tablet Take 1 tablet (0.5 mg total) by mouth 2 (two) times daily. 04/17/18   Remus Loffler, PA-C  amLODipine (NORVASC) 10 MG tablet Take 10 mg by mouth daily.  01/14/17   [provider]  COMBIVENT RESPIMAT 20-100 MCG/ACT AERS respimat Inhale 1 puff into the lungs every 6 (six) hours  as needed. 06/27/17   Remus Loffler, PA-C  FLUoxetine (PROZAC) 20 MG capsule Take 20 mg by mouth daily.  01/14/17   [provider]  hydrochlorothiazide (HYDRODIURIL) 25 MG tablet Take 1 tablet by mouth every morning. 04/26/14   [provider]  HYDROmorphone (DILAUDID) 4 MG tablet Take 1 tablet (4 mg total) by mouth every 6 (six) hours as needed for severe pain (kidney stones). 04/17/18   Remus Loffler, PA-C  levothyroxine (SYNTHROID, LEVOTHROID) 75 MCG tablet Take 1 tablet (75 mcg total) by mouth every morning. 06/27/17   Remus Loffler, PA-C  lisinopril (PRINIVIL,ZESTRIL) 20 MG tablet Take 1 tablet (20 mg total) by mouth daily. 03/28/17   Remus Loffler, PA-C  oxycodone (ROXICODONE) 30 MG immediate release tablet Take 1 tablet (30 mg total) by mouth every 4 (four) hours as needed for pain. 04/17/18   Remus Loffler, PA-C  oxycodone (ROXICODONE) 30 MG immediate release tablet Take 1 tablet (30 mg total) by mouth every 6 (six) hours as needed for pain. 04/17/18   Remus Loffler, PA-C  oxycodone (ROXICODONE) 30 MG immediate release tablet Take 1 tablet (30 mg total) by mouth every 4 (four) hours as needed for pain. 04/17/18   Remus Loffler, PA-C  tamsulosin (FLOMAX) 0.4 MG CAPS capsule Take 0.4 mg by mouth daily.  01/22/17   [provider]  topiramate (  TOPAMAX) 25 MG tablet Take 1-4 tablets (25-100 mg total) by mouth daily. 06/27/17   Remus Loffler, PA-C    Family History Family History  Problem Relation Age of Onset  . COPD Mother   . Hypertension Father   . Cancer Brother     Social History Social History   Tobacco Use  . Smoking status: Current Every Day Smoker  . Smokeless tobacco: Never Used  Substance Use Topics  . Alcohol use: No  . Drug use: No     Allergies   Penicillins and Ivp dye [iodinated diagnostic agents]   Review of Systems Review of Systems  Constitutional: Negative for fever.  Respiratory: Negative for shortness of breath.     Cardiovascular: Negative for chest pain.  Gastrointestinal: Negative for abdominal pain, nausea and vomiting.  Genitourinary: Negative for dysuria.  Psychiatric/Behavioral: Negative for self-injury and suicidal ideas.  All other systems reviewed and are negative.    Physical Exam Updated Vital Signs BP (!) 190/107 (BP Location: Left Arm)   Pulse 97   Temp 97.6 F (36.4 C) (Oral)   Resp (!) 22   Ht 6' (1.829 m)   Wt 109.3 kg (241 lb)   SpO2 96%   BMI 32.69 kg/m   Physical Exam  Constitutional: He is oriented to person, place, and time. He appears well-developed and well-nourished.  Disheveled appearing, nontoxic  HENT:  Head: Normocephalic and atraumatic.  Eyes: Pupils are equal, round, and reactive to light.  Equals 5 mm reactive bilaterally  Neck: Neck supple.  Cardiovascular: Normal rate, regular rhythm and normal heart sounds.  No murmur heard. Extensive scarring noted over the right anterior chest  Pulmonary/Chest: Effort normal and breath sounds normal. No respiratory distress. He has no wheezes.  Abdominal: Soft. Bowel sounds are normal. There is no tenderness. There is no rebound.  Musculoskeletal: He exhibits no edema.  Lymphadenopathy:    He has no cervical adenopathy.  Neurological: He is alert and oriented to person, place, and time.  Skin: Skin is warm and dry.  Track marks bilateral upper extremities  Psychiatric: He has a normal mood and affect.  Nursing note and vitals reviewed.    ED Treatments / Results  Labs (all labs ordered are listed, but only abnormal results are displayed) Labs Reviewed - No data to display  EKG EKG Interpretation  Date/Time:  Friday Apr 28 2018 00:58:25 EDT Ventricular Rate:  98 PR Interval:    QRS Duration: 115 QT Interval:  356 QTC Calculation: 455 R Axis:   55 Text Interpretation:  Sinus rhythm ST elevation, consider early repolarization Confirmed by Ross Marcus ([id number]) on 04/28/2018 1:01:45  AM   Radiology No results found.  Procedures Procedures (including critical care time)  Medications Ordered in ED Medications - No data to display   Initial Impression / Assessment and Plan / ED Course  I have reviewed the triage vital signs and the nursing notes.  Pertinent labs & imaging results that were available during my care of the patient were reviewed by me and considered in my medical decision making (see chart for details).     Patient presents after likely heroin overdose.  His mental status and breathing improved with Narcan.  He is currently nontoxic-appearing.  Vital signs notable for blood pressure 190/107.  He has multiple track marks.  He is a chronic opiate user.  EKG is nonischemic.  Will observe  2:59 AM On recheck, patient is awake, alert, and oriented.  He has had  no further episodes of apnea or somnolence.  I discussed with patient at length the dangers of IV drug use and that he could kill himself unintentionally.  Patient states understanding.  After history, exam, and medical workup I feel the patient has been appropriately medically screened and is safe for discharge home. Pertinent diagnoses were discussed with the patient. Patient was given return precautions.   Final Clinical Impressions(s) / ED Diagnoses   Final diagnoses:  Accidental overdose of heroin, initial encounter [facility])    ED Discharge Orders    None       Wilkie Aye, Mayer Masker, MD 04/28/18 [phone]

## 2018-04-28 NOTE — ED Triage Notes (Signed)
Patient was shooting up heroin and became unresponsive, given two intranasal Narcans by [facility].  Patient states he was not trying to harm himself, but get high.

## 2018-05-08 ENCOUNTER — Telehealth: Payer: Self-pay | Admitting: Physician Assistant

## 2018-05-08 NOTE — Telephone Encounter (Signed)
Attempted to contact patient - number not in service.  [facility] aware rx can not be filled early.

## 2018-05-08 NOTE — Telephone Encounter (Signed)
Patient states that he is leaving for the beach on Friday and is requesting his oxycodone prescription to be filled on Friday the 14th instead of the 19th

## 2018-05-08 NOTE — Telephone Encounter (Signed)
No, I saw that he had an overdose and positive drug screen at the hospital. We will be weaning him off at his next visit. So I cannot approve an early refill.

## 2018-06-27 ENCOUNTER — Emergency Department (HOSPITAL_COMMUNITY)
Admission: EM | Admit: 2018-06-27 | Discharge: 2018-06-28 | Discharge status: Against Medical Advice | Payer: Self-pay | Attending: Emergency Medicine | Admitting: Emergency Medicine

## 2018-06-27 ENCOUNTER — Other Ambulatory Visit: Payer: Self-pay

## 2018-06-27 ENCOUNTER — Emergency Department (HOSPITAL_COMMUNITY): Payer: Self-pay

## 2018-06-27 ENCOUNTER — Encounter (HOSPITAL_COMMUNITY): Payer: Self-pay | Admitting: *Deleted

## 2018-06-27 DIAGNOSIS — J690 Pneumonitis due to inhalation of food and vomit: Secondary | ICD-10-CM | POA: Insufficient documentation

## 2018-06-27 DIAGNOSIS — I10 Essential (primary) hypertension: Secondary | ICD-10-CM | POA: Insufficient documentation

## 2018-06-27 DIAGNOSIS — R0902 Hypoxemia: Secondary | ICD-10-CM | POA: Insufficient documentation

## 2018-06-27 DIAGNOSIS — T401X1A Poisoning by heroin, accidental (unintentional), initial encounter: Secondary | ICD-10-CM | POA: Insufficient documentation

## 2018-06-27 DIAGNOSIS — E039 Hypothyroidism, unspecified: Secondary | ICD-10-CM | POA: Insufficient documentation

## 2018-06-27 DIAGNOSIS — Z79899 Other long term (current) drug therapy: Secondary | ICD-10-CM | POA: Insufficient documentation

## 2018-06-27 DIAGNOSIS — F17200 Nicotine dependence, unspecified, uncomplicated: Secondary | ICD-10-CM | POA: Insufficient documentation

## 2018-06-27 MED ORDER — CLONIDINE HCL 0.1 MG PO TABS
0.1000 mg | ORAL_TABLET | Freq: Once | ORAL | Status: AC
  Administered 2018-06-27: 0.1 mg via ORAL
  Filled 2018-06-27: qty 1

## 2018-06-27 NOTE — ED Triage Notes (Signed)
Pt brought in by [facility] for heroin overdose; pt was given narcan 4mg  nasally by rescue prior to ems arrival; pt vomited; pt is alert at this time

## 2018-06-27 NOTE — ED Provider Notes (Signed)
[facility] EMERGENCY DEPARTMENT Provider Note   CSN: [id number] Arrival date & time: 06/27/18  2327  Time seen 23:35 PM    History   Chief Complaint Chief Complaint  Patient presents with  . Drug Overdose    HPI Joseph Stafford is a 47 y.o. male.  HPI patient was brought to the emergency department by EMS.  They report when they arrived on the scene patient had already been treated by first responders, the fire department in police.  He received intranasal Narcan.  They states he did have some vomiting at the scene.  They also state he was "not breathing".  Patient states he only started doing heroin 2 months ago.  He states the last time he did it was 1 to 2 weeks ago.  He states he got the heroin tonight from a different supplier.  He states that he has been told he overdosed before and was taking to the hospital for the overdose.  However patient does not believe he overdosed.  Patient states he does not know why he started or when.  Patient states he remembers waking up at his home and he states he walked out to the ambulance.  He denies coughing or feeling short of breath.  He denies vomiting.  PCP Remus LofflerJones, Angel S, PA-C   Past Medical History:  Diagnosis Date  . Chronic back pain   . Hypertension   . Thyroid disease     Patient Active Problem List   Diagnosis Date Noted  . Vitamin B12 deficiency 06/27/2017  . History of benign thyroid tumor 03/28/2017  . Acquired hypothyroidism 03/28/2017  . Chronic pain syndrome 03/28/2017  . DDD (degenerative disc disease), lumbar 03/28/2017  . Essential hypertension 03/28/2017    Past Surgical History:  Procedure Laterality Date  . desmoid tumor removed          Home Medications    Prior to Admission medications   Medication Sig Start Date End Date Taking? Authorizing Provider  ALPRAZolam Prudy Feeler(XANAX) 0.5 MG tablet Take 1 tablet (0.5 mg total) by mouth 2 (two) times daily. 04/17/18   Remus LofflerJones, Angel S, PA-C  amLODipine (NORVASC)  10 MG tablet Take 10 mg by mouth daily.  01/14/17   [provider]  COMBIVENT RESPIMAT 20-100 MCG/ACT AERS respimat Inhale 1 puff into the lungs every 6 (six) hours as needed. 06/27/17   Remus LofflerJones, Angel S, PA-C  FLUoxetine (PROZAC) 20 MG capsule Take 20 mg by mouth daily.  01/14/17   [provider]  hydrochlorothiazide (HYDRODIURIL) 25 MG tablet Take 1 tablet by mouth every morning. 04/26/14   [provider]  HYDROmorphone (DILAUDID) 4 MG tablet Take 1 tablet (4 mg total) by mouth every 6 (six) hours as needed for severe pain (kidney stones). 04/17/18   Remus LofflerJones, Angel S, PA-C  levothyroxine (SYNTHROID, LEVOTHROID) 75 MCG tablet Take 1 tablet (75 mcg total) by mouth every morning. 06/27/17   Remus LofflerJones, Angel S, PA-C  lisinopril (PRINIVIL,ZESTRIL) 20 MG tablet Take 1 tablet (20 mg total) by mouth daily. 03/28/17   Remus LofflerJones, Angel S, PA-C  oxycodone (ROXICODONE) 30 MG immediate release tablet Take 1 tablet (30 mg total) by mouth every 4 (four) hours as needed for pain. 04/17/18   Remus LofflerJones, Angel S, PA-C  oxycodone (ROXICODONE) 30 MG immediate release tablet Take 1 tablet (30 mg total) by mouth every 6 (six) hours as needed for pain. 04/17/18   Remus LofflerJones, Angel S, PA-C  oxycodone (ROXICODONE) 30 MG immediate release tablet Take  1 tablet (30 mg total) by mouth every 4 (four) hours as needed for pain. 04/17/18   Remus Loffler, PA-C  tamsulosin (FLOMAX) 0.4 MG CAPS capsule Take 0.4 mg by mouth daily.  01/22/17   [provider]  topiramate (TOPAMAX) 25 MG tablet Take 1-4 tablets (25-100 mg total) by mouth daily. 06/27/17   Remus Loffler, PA-C    Family History Family History  Problem Relation Age of Onset  . COPD Mother   . Hypertension Father   . Cancer Brother     Social History Social History   Tobacco Use  . Smoking status: Current Every Day Smoker  . Smokeless tobacco: Never Used  Substance Use Topics  . Alcohol use: No  . Drug use: No  Started heroin in May per prior ED  chart   Allergies   Penicillins and Ivp dye [iodinated diagnostic agents]   Review of Systems Review of Systems  All other systems reviewed and are negative.    Physical Exam Updated Vital Signs BP (!) 179/127   Pulse 100   Temp 98.7 F (37.1 C)   Resp 14   SpO2 (!) 89%   Vital signs normal except for hypertension, borderline tachycardia, and hypoxia   Physical Exam  Constitutional: He is oriented to person, place, and time. He appears well-developed and well-nourished.  Non-toxic appearance. He does not appear ill. No distress.  HENT:  Head: Normocephalic and atraumatic.  Right Ear: External ear normal.  Left Ear: External ear normal.  Nose: Nose normal. No mucosal edema or rhinorrhea.  Mouth/Throat: Oropharynx is clear and moist and mucous membranes are normal. No dental abscesses or uvula swelling.  Eyes: Pupils are equal, round, and reactive to light. Conjunctivae and EOM are normal.  Neck: Normal range of motion and full passive range of motion without pain. Neck supple.  Cardiovascular: Normal rate, regular rhythm and normal heart sounds. Exam reveals no gallop and no friction rub.  No murmur heard. Pulmonary/Chest: Effort normal and breath sounds normal. No respiratory distress. He has no wheezes. He has no rhonchi. He has no rales. He exhibits no tenderness and no crepitus.  Abdominal: Soft. Normal appearance and bowel sounds are normal. He exhibits no distension. There is no tenderness. There is no rebound and no guarding.  Musculoskeletal: Normal range of motion. He exhibits no edema or tenderness.  Moves all extremities well.   Neurological: He is alert and oriented to person, place, and time. He has normal strength. No cranial nerve deficit.  Skin: Skin is warm and intact. No rash noted. He is diaphoretic. No erythema. There is pallor.  Patient has multiple old track marks on his arms.  Psychiatric: He has a normal mood and affect. His speech is normal and  behavior is normal. His mood appears not anxious.  Nursing note and vitals reviewed.    ED Treatments / Results  Labs (all labs ordered are listed, but only abnormal results are displayed) Labs Reviewed  COMPREHENSIVE METABOLIC PANEL  CBC WITH DIFFERENTIAL/PLATELET  URINALYSIS, ROUTINE W REFLEX MICROSCOPIC    EKG None  Radiology Dg Chest 2 View  Result Date: 06/28/2018 CLINICAL DATA:  Heroin overdose, vomiting, hypoxia EXAM: CHEST - 2 VIEW COMPARISON:  10/16/2008 FINDINGS: Streaky interstitial/patchy opacities in the bilateral upper lobes and left lower lobe, suspicious for pneumonia/aspiration. No pleural effusion or pneumothorax. The heart is top-normal in size. Visualized osseous structures are within normal limits. IMPRESSION: Multifocal patchy opacities, suspicious for pneumonia/aspiration. Electronically Signed  By: Charline Bills M.D.   On: 06/28/2018 02:21    Procedures Procedures (including critical care time)  Medications Ordered in ED Medications  clindamycin (CLEOCIN) IVPB 900 mg (has no administration in time range)  cloNIDine (CATAPRES) tablet 0.1 mg (0.1 mg Oral Given 06/27/18 2355)     Initial Impression / Assessment and Plan / ED Course  I have reviewed the triage vital signs and the nursing notes.  Pertinent labs & imaging results that were available during my care of the patient were reviewed by me and considered in my medical decision making (see chart for details).     Patient's blood pressure was noted to be very high, I rechecked it and talk to the patient about staying relaxed while he was checking his blood pressure.  It was 182/113.  He states "that is good".  He states he does not recall if he took his blood pressure medication today.  He was given clonidine orally.  Patient was also noted to have a pulse ox 89% on arrival.  On nasal oxygen at 2 L/min nasal cannula his pulse ox was only 91%.  Vomiting at the scene.  Chest x-ray was done to look  for aspiration.  Patient states he is on chronic pain medications for degenerative disc disease in his back.  Nurses report patient keeps taking off his nasal cannula oxygen and putting it on his forehead.  When I reviewed patient's x-ray was suspicious for aspiration pneumonia.  IV antibiotics were ordered.  However although patient states he is only been abusing IV drugs for 2 months nursing staff was having great difficulty getting IV started.  They were also unable to get any blood work done.  A person showed up to pick up patient and despite knowing he has pneumonia and his oxygen was low patient decided to leave. 04:24 AM He walked out and was pulling his EKG stickers off his chest as he walked down the hall.  I explained to him that the low oxygen was a stress on his heart and that he had pneumonia.  He states he did not want to stay.  I informed him he was allowed to make bad decisions.  Review of the [place] shows patient has been getting #180 oxycodone 30 mg tablets monthly from his primary care doctor, last filled July 19, #60 alprazolam 0.5 mg tablets monthly from his PCP last filled June 24.  Final Clinical Impressions(s) / ED Diagnoses   Final diagnoses:  Accidental overdose of heroin, initial encounter (HCC)  Aspiration pneumonia of both lungs, unspecified aspiration pneumonia type, unspecified part of lung (HCC)  Hypoxia    Pt left AMA  Devoria Albe, MD, Concha Pyo, MD 06/28/18 [phone]

## 2018-06-28 MED ORDER — CLINDAMYCIN PHOSPHATE 900 MG/50ML IV SOLN
900.0000 mg | Freq: Once | INTRAVENOUS | Status: DC
  Filled 2018-06-28: qty 50

## 2018-06-28 NOTE — ED Notes (Addendum)
Pt family at nurses station stating pt was ready to go because family had to get to work stating "I have no sick time and I have 3 kids to feed". Pt family also states "were leaving". MD notified. This nurse and MD discussed with the pt diagnosis from results and the risks of pt leaving AMA. Pt verbalized understanding of risks. Pt ambulated with family out of ED.

## 2018-07-19 ENCOUNTER — Encounter: Payer: Self-pay | Admitting: Physician Assistant

## 2018-07-19 ENCOUNTER — Ambulatory Visit (INDEPENDENT_AMBULATORY_CARE_PROVIDER_SITE_OTHER): Payer: Self-pay | Admitting: Physician Assistant

## 2018-07-19 VITALS — BP 207/139 | HR 88 | Temp 97.4°F | Ht 72.0 in | Wt 216.0 lb

## 2018-07-19 DIAGNOSIS — G894 Chronic pain syndrome: Secondary | ICD-10-CM

## 2018-07-19 DIAGNOSIS — M51369 Other intervertebral disc degeneration, lumbar region without mention of lumbar back pain or lower extremity pain: Secondary | ICD-10-CM

## 2018-07-19 DIAGNOSIS — N200 Calculus of kidney: Secondary | ICD-10-CM

## 2018-07-19 DIAGNOSIS — M5136 Other intervertebral disc degeneration, lumbar region: Secondary | ICD-10-CM

## 2018-07-19 MED ORDER — HYDROMORPHONE HCL 4 MG PO TABS: 4.0000 mg | ORAL_TABLET | Freq: Four times a day (QID) | ORAL | 0 refills | Status: DC | PRN

## 2018-07-19 MED ORDER — OXYCODONE HCL 30 MG PO TABS: 30.0000 mg | ORAL_TABLET | ORAL | 0 refills | Status: DC | PRN

## 2018-07-19 MED ORDER — OXYCODONE HCL 30 MG PO TABS: 30.0000 mg | ORAL_TABLET | Freq: Four times a day (QID) | ORAL | 0 refills | Status: DC | PRN

## 2018-07-19 MED ORDER — SPIRONOLACTONE 25 MG PO TABS: 25.0000 mg | ORAL_TABLET | Freq: Every day | ORAL | 3 refills | Status: DC

## 2018-07-24 DIAGNOSIS — N200 Calculus of kidney: Secondary | ICD-10-CM | POA: Insufficient documentation

## 2018-07-24 NOTE — Progress Notes (Signed)
BP (!) 207/139   Pulse 88   Temp (!) 97.4 F (36.3 C) (Oral)   Ht 6' (1.829 m)   Wt 216 lb (98 kg)   BMI 29.29 kg/m    Subjective:    Patient ID: Joseph Stafford, male    DOB: 1971-09-07, 47 y.o.   MRN: [id number]  HPI: Joseph Stafford is a 47 y.o. male presenting on 07/19/2018 for Medication Refill  Chronic pain is under control, patient has no new complaints. Medications are keeping things stable. Needs refills for the next three months.  [place] Controlled Substance website checked and normal. Drug screen normal this year.   Has DDD lumbar disc, kidney stones, chronic pain  Past Medical History:  Diagnosis Date  . Chronic back pain   . Hypertension   . Thyroid disease    Relevant past medical, surgical, family and social history reviewed and updated as indicated. Interim medical history since our last visit reviewed. Allergies and medications reviewed and updated. DATA REVIEWED: CHART IN EPIC  Family History reviewed for pertinent findings.  Review of Systems  Constitutional: Negative.  Negative for appetite change and fatigue.  Eyes: Negative for pain and visual disturbance.  Respiratory: Negative.  Negative for cough, chest tightness, shortness of breath and wheezing.   Cardiovascular: Negative.  Negative for chest pain, palpitations and leg swelling.  Gastrointestinal: Negative.  Negative for abdominal pain, diarrhea, nausea and vomiting.  Genitourinary: Negative.   Musculoskeletal: Positive for arthralgias, back pain and myalgias.  Skin: Negative.  Negative for color change and rash.  Neurological: Negative.  Negative for weakness, numbness and headaches.  Psychiatric/Behavioral: Negative.     Allergies as of 07/19/2018      Reactions   Penicillins Nausea Only   Ivp Dye [iodinated Diagnostic Agents]       Medication List        Accurate as of 07/19/18 11:59 PM. Always use your most recent med list.          ALPRAZolam 0.5 MG tablet Commonly known as:   XANAX Take 1 tablet (0.5 mg total) by mouth 2 (two) times daily.   amLODipine 10 MG tablet Commonly known as:  NORVASC Take 10 mg by mouth daily.   COMBIVENT RESPIMAT 20-100 MCG/ACT Aers respimat Generic drug:  Ipratropium-Albuterol Inhale 1 puff into the lungs every 6 (six) hours as needed.   FLUoxetine 20 MG capsule Commonly known as:  PROZAC Take 20 mg by mouth daily.   HYDROmorphone 4 MG tablet Commonly known as:  DILAUDID Take 1 tablet (4 mg total) by mouth every 6 (six) hours as needed for severe pain (kidney stones).   levothyroxine 75 MCG tablet Commonly known as:  SYNTHROID, LEVOTHROID Take 1 tablet (75 mcg total) by mouth every morning.   lisinopril 20 MG tablet Commonly known as:  PRINIVIL,ZESTRIL Take 1 tablet (20 mg total) by mouth daily.   oxycodone 30 MG immediate release tablet Commonly known as:  ROXICODONE Take 1 tablet (30 mg total) by mouth every 4 (four) hours as needed for pain.   oxycodone 30 MG immediate release tablet Commonly known as:  ROXICODONE Take 1 tablet (30 mg total) by mouth every 4 (four) hours as needed for pain.   oxycodone 30 MG immediate release tablet Commonly known as:  ROXICODONE Take 1 tablet (30 mg total) by mouth every 6 (six) hours as needed for pain.   spironolactone 25 MG tablet Commonly known as:  ALDACTONE Take 1 tablet (25 mg total)  by mouth daily.   tamsulosin 0.4 MG Caps capsule Commonly known as:  FLOMAX Take 0.4 mg by mouth daily.   topiramate 25 MG tablet Commonly known as:  TOPAMAX Take 1-4 tablets (25-100 mg total) by mouth daily.          Objective:    BP (!) 207/139   Pulse 88   Temp (!) 97.4 F (36.3 C) (Oral)   Ht 6' (1.829 m)   Wt 216 lb (98 kg)   BMI 29.29 kg/m   Allergies  Allergen Reactions  . Penicillins Nausea Only  . Ivp Dye [Iodinated Diagnostic Agents]     Wt Readings from Last 3 Encounters:  07/19/18 216 lb (98 kg)  04/28/18 241 lb (109.3 kg)  04/17/18 241 lb 12.8 oz  (109.7 kg)    Physical Exam  Constitutional: He appears well-developed and well-nourished. No distress.  HENT:  Head: Normocephalic and atraumatic.  Eyes: Pupils are equal, round, and reactive to light. Conjunctivae and EOM are normal.  Cardiovascular: Normal rate, regular rhythm and normal heart sounds.  Pulmonary/Chest: Effort normal and breath sounds normal. No respiratory distress.  Musculoskeletal:       Lumbar back: He exhibits decreased range of motion, tenderness, pain and spasm.  Neurological:  Reflex Scores:      Patellar reflexes are 1+ on the right side and 3+ on the left side. Skin: Skin is warm and dry.  Psychiatric: He has a normal mood and affect. His behavior is normal.  Nursing note and vitals reviewed.       Assessment & Plan:   1. DDD (degenerative disc disease), lumbar - ToxASSURE Select 13 (MW), Urine - oxycodone (ROXICODONE) 30 MG immediate release tablet; Take 1 tablet (30 mg total) by mouth every 4 (four) hours as needed for pain.  Dispense: 180 tablet; Refill: 0 - oxycodone (ROXICODONE) 30 MG immediate release tablet; Take 1 tablet (30 mg total) by mouth every 4 (four) hours as needed for pain.  Dispense: 180 tablet; Refill: 0 - oxycodone (ROXICODONE) 30 MG immediate release tablet; Take 1 tablet (30 mg total) by mouth every 6 (six) hours as needed for pain.  Dispense: 180 tablet; Refill: 0  2. Kidney stones - HYDROmorphone (DILAUDID) 4 MG tablet; Take 1 tablet (4 mg total) by mouth every 6 (six) hours as needed for severe pain (kidney stones).  Dispense: 30 tablet; Refill: 0  3. Chronic pain syndrome - ToxASSURE Select 13 (MW), Urine   Continue all other maintenance medications as listed above.  Follow up plan: Return in about 3 months (around 10/19/2018).  Educational handout given for survey  Remus LofflerAngel S. Arlis Everly PA-C [facility] [address]  [place], [place] [address] [phone]   07/24/2018, 10:13 PM

## 2018-09-08 ENCOUNTER — Encounter: Payer: Self-pay | Admitting: Physician Assistant

## 2018-09-08 ENCOUNTER — Ambulatory Visit (INDEPENDENT_AMBULATORY_CARE_PROVIDER_SITE_OTHER): Payer: Self-pay | Admitting: Physician Assistant

## 2018-09-08 DIAGNOSIS — N200 Calculus of kidney: Secondary | ICD-10-CM

## 2018-09-08 DIAGNOSIS — I10 Essential (primary) hypertension: Secondary | ICD-10-CM

## 2018-09-08 DIAGNOSIS — M5136 Other intervertebral disc degeneration, lumbar region: Secondary | ICD-10-CM

## 2018-09-08 MED ORDER — OXYCODONE HCL 30 MG PO TABS
30.0000 mg | ORAL_TABLET | Freq: Four times a day (QID) | ORAL | 0 refills | Status: DC | PRN
Start: 2018-09-08 — End: 2018-11-17

## 2018-09-08 MED ORDER — OXYCODONE HCL 30 MG PO TABS: 30.0000 mg | ORAL_TABLET | ORAL | 0 refills | Status: DC | PRN

## 2018-09-08 MED ORDER — OXYCODONE HCL 30 MG PO TABS: 30.0000 mg | ORAL_TABLET | Freq: Four times a day (QID) | ORAL | 0 refills | Status: DC | PRN

## 2018-09-08 MED ORDER — AMLODIPINE BESYLATE 10 MG PO TABS: 10.0000 mg | ORAL_TABLET | Freq: Every day | ORAL | 1 refills | Status: DC

## 2018-09-08 MED ORDER — LISINOPRIL 20 MG PO TABS: 20.0000 mg | ORAL_TABLET | Freq: Every day | ORAL | 3 refills | Status: DC

## 2018-09-08 MED ORDER — LEVOTHYROXINE SODIUM 75 MCG PO TABS: 75.0000 ug | ORAL_TABLET | Freq: Every morning | ORAL | 1 refills | Status: DC

## 2018-09-08 MED ORDER — SPIRONOLACTONE 25 MG PO TABS: 25.0000 mg | ORAL_TABLET | Freq: Every day | ORAL | 3 refills | Status: DC

## 2018-09-08 MED ORDER — HYDROMORPHONE HCL 4 MG PO TABS: 4.0000 mg | ORAL_TABLET | Freq: Four times a day (QID) | ORAL | 0 refills | Status: DC | PRN

## 2018-09-11 NOTE — Progress Notes (Signed)
BP (!) 190/115   Pulse 98   Ht 6' (1.829 m)   Wt 213 lb 6.4 oz (96.8 kg)   BMI 28.94 kg/m    Subjective:    Patient ID: Joseph Stafford, male    DOB: 25-Feb-1971, 48 y.o.   MRN: [id number]  HPI: Joseph Stafford is a 47 y.o. male presenting on 09/08/2018 for Medication Refill  This patient comes in for periodic recheck on medications and conditions including hypertension, DDD and history of kidney stones. 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 He will be away for up to one month at a time.  He has not other issues. The blood pressure medication had been missed.   All medications are reviewed today. There are no reports of any problems with the medications. All of the medical conditions are reviewed and updated.  Lab work is reviewed and will be ordered as medically necessary. There are no new problems reported with today's visit.   Past Medical History:  Diagnosis Date  . Chronic back pain   . Hypertension   . Thyroid disease    Relevant past medical, surgical, family and social history reviewed and updated as indicated. Interim medical history since our last visit reviewed. Allergies and medications reviewed and updated. DATA REVIEWED: CHART IN EPIC  Family History reviewed for pertinent findings.  Review of Systems  Constitutional: Negative.  Negative for appetite change and fatigue.  HENT: Negative.   Eyes: Negative.  Negative for pain and visual disturbance.  Respiratory: Negative.  Negative for cough, chest tightness, shortness of breath and wheezing.   Cardiovascular: Negative.  Negative for chest pain, palpitations and leg swelling.  Gastrointestinal: Negative.  Negative for abdominal pain, diarrhea, nausea and vomiting.  Endocrine: Negative.   Genitourinary: Negative.   Musculoskeletal: Positive for arthralgias, back pain and myalgias.  Skin: Negative.  Negative for color change and rash.  Neurological: Negative.  Negative for  weakness, numbness and headaches.  Psychiatric/Behavioral: Negative.     Allergies as of 09/08/2018      Reactions   Penicillins Nausea Only   Ivp Dye [iodinated Diagnostic Agents]       Medication List        Accurate as of 09/08/18 11:59 PM. Always use your most recent med list.          amLODipine 10 MG tablet Commonly known as:  NORVASC Take 1 tablet (10 mg total) by mouth daily.   COMBIVENT RESPIMAT 20-100 MCG/ACT Aers respimat Generic drug:  Ipratropium-Albuterol Inhale 1 puff into the lungs every 6 (six) hours as needed.   HYDROmorphone 4 MG tablet Commonly known as:  DILAUDID Take 1 tablet (4 mg total) by mouth every 6 (six) hours as needed for severe pain (kidney stones).   levothyroxine 75 MCG tablet Commonly known as:  SYNTHROID, LEVOTHROID Take 1 tablet (75 mcg total) by mouth every morning.   lisinopril 20 MG tablet Commonly known as:  PRINIVIL,ZESTRIL Take 1 tablet (20 mg total) by mouth daily.   oxycodone 30 MG immediate release tablet Commonly known as:  ROXICODONE Take 1-2 tablets (30-60 mg total) by mouth every 6 (six) hours as needed for pain.   oxycodone 30 MG immediate release tablet Commonly known as:  ROXICODONE Take 1-2 tablets (30-60 mg total) by mouth every 6 (six) hours as needed for pain.   oxycodone 30 MG immediate release tablet Commonly known as:  ROXICODONE Take 1-2 tablets (30-60 mg total)  by mouth every 6 (six) hours as needed for pain.   spironolactone 25 MG tablet Commonly known as:  ALDACTONE Take 1 tablet (25 mg total) by mouth daily.          Objective:    BP (!) 190/115   Pulse 98   Ht 6' (1.829 m)   Wt 213 lb 6.4 oz (96.8 kg)   BMI 28.94 kg/m   Allergies  Allergen Reactions  . Penicillins Nausea Only  . Ivp Dye [Iodinated Diagnostic Agents]     Wt Readings from Last 3 Encounters:  09/08/18 213 lb 6.4 oz (96.8 kg)  07/19/18 216 lb (98 kg)  04/28/18 241 lb (109.3 kg)    Physical Exam  Constitutional:  He appears well-developed and well-nourished. No distress.  HENT:  Head: Normocephalic and atraumatic.  Eyes: Pupils are equal, round, and reactive to light. Conjunctivae and EOM are normal.  Cardiovascular: Normal rate, regular rhythm and normal heart sounds.  Pulmonary/Chest: Effort normal and breath sounds normal. No respiratory distress.  Skin: Skin is warm and dry.  Psychiatric: He has a normal mood and affect. His behavior is normal.  Nursing note and vitals reviewed.       Assessment & Plan:   1. Essential hypertension - amLODipine (NORVASC) 10 MG tablet; Take 1 tablet (10 mg total) by mouth daily.  Dispense: 90 tablet; Refill: 1 - lisinopril (PRINIVIL,ZESTRIL) 20 MG tablet; Take 1 tablet (20 mg total) by mouth daily.  Dispense: 90 tablet; Refill: 3  2. DDD (degenerative disc disease), lumbar - oxycodone (ROXICODONE) 30 MG immediate release tablet; Take 1-2 tablets (30-60 mg total) by mouth every 6 (six) hours as needed for pain.  Dispense: 240 tablet; Refill: 0 - oxycodone (ROXICODONE) 30 MG immediate release tablet; Take 1-2 tablets (30-60 mg total) by mouth every 6 (six) hours as needed for pain.  Dispense: 240 tablet; Refill: 0 - oxycodone (ROXICODONE) 30 MG immediate release tablet; Take 1-2 tablets (30-60 mg total) by mouth every 6 (six) hours as needed for pain.  Dispense: 240 tablet; Refill: 0  3. Kidney stones - HYDROmorphone (DILAUDID) 4 MG tablet; Take 1 tablet (4 mg total) by mouth every 6 (six) hours as needed for severe pain (kidney stones).  Dispense: 30 tablet; Refill: 0   Continue all other maintenance medications as listed above.  Follow up plan: Return in about 3 months (around 12/09/2018).  Educational handout given for survey  Remus Loffler PA-C [facility] [address]  [place], [place] [address] [phone]   09/11/2018, 7:24 PM

## 2018-10-01 ENCOUNTER — Other Ambulatory Visit: Payer: Self-pay | Admitting: Physician Assistant

## 2018-10-01 DIAGNOSIS — F419 Anxiety disorder, unspecified: Secondary | ICD-10-CM

## 2018-10-20 ENCOUNTER — Ambulatory Visit: Payer: Self-pay | Admitting: Physician Assistant

## 2018-10-31 ENCOUNTER — Encounter: Payer: Self-pay | Admitting: Physician Assistant

## 2018-11-15 ENCOUNTER — Telehealth: Payer: Self-pay | Admitting: Physician Assistant

## 2018-11-15 NOTE — Telephone Encounter (Signed)
Pt has appt 11/20/18 with Lawanna KobusAngel and wants Gabapentin for the burning in his feet. He says he was on it 20 years ago. Pt advised he may ntbs for this but he wanted me to ask anyway.

## 2018-11-17 ENCOUNTER — Telehealth: Payer: Self-pay

## 2018-11-17 ENCOUNTER — Telehealth: Payer: Self-pay | Admitting: Physician Assistant

## 2018-11-17 ENCOUNTER — Encounter: Payer: Self-pay | Admitting: Physician Assistant

## 2018-11-17 ENCOUNTER — Ambulatory Visit (INDEPENDENT_AMBULATORY_CARE_PROVIDER_SITE_OTHER): Payer: Self-pay | Admitting: Physician Assistant

## 2018-11-17 DIAGNOSIS — N200 Calculus of kidney: Secondary | ICD-10-CM

## 2018-11-17 DIAGNOSIS — I10 Essential (primary) hypertension: Secondary | ICD-10-CM

## 2018-11-17 DIAGNOSIS — M5136 Other intervertebral disc degeneration, lumbar region: Secondary | ICD-10-CM

## 2018-11-17 MED ORDER — HYDROMORPHONE HCL 4 MG PO TABS: 4.0000 mg | ORAL_TABLET | Freq: Four times a day (QID) | ORAL | 0 refills | Status: DC | PRN

## 2018-11-17 MED ORDER — OXYCODONE HCL 30 MG PO TABS: 30.0000 mg | ORAL_TABLET | Freq: Four times a day (QID) | ORAL | 0 refills | Status: DC | PRN

## 2018-11-17 MED ORDER — LEVOTHYROXINE SODIUM 75 MCG PO TABS: 75.0000 ug | ORAL_TABLET | Freq: Every morning | ORAL | 1 refills | Status: DC

## 2018-11-17 MED ORDER — LISINOPRIL 20 MG PO TABS: 20.0000 mg | ORAL_TABLET | Freq: Every day | ORAL | 3 refills | Status: DC

## 2018-11-17 MED ORDER — GABAPENTIN 300 MG PO CAPS: 300.0000 mg | ORAL_CAPSULE | Freq: Three times a day (TID) | ORAL | 5 refills | Status: DC

## 2018-11-17 MED ORDER — AMLODIPINE BESYLATE 10 MG PO TABS: 10.0000 mg | ORAL_TABLET | Freq: Every day | ORAL | 1 refills | Status: DC

## 2018-11-17 MED ORDER — SPIRONOLACTONE 25 MG PO TABS: 25.0000 mg | ORAL_TABLET | Freq: Every day | ORAL | 3 refills | Status: DC

## 2018-11-17 NOTE — Telephone Encounter (Signed)
Will address at his appointment.

## 2018-11-17 NOTE — Telephone Encounter (Signed)
Patient given Dilaudid and Hydrocodone which come up to 420 millieq. A day and only suppose to fill up to 90 millieq per day unless cancer dx

## 2018-11-17 NOTE — Telephone Encounter (Signed)
They need permission to fill rx

## 2018-11-17 NOTE — Telephone Encounter (Signed)
Fill hydrocodone if choice is needed.

## 2018-11-17 NOTE — Telephone Encounter (Signed)
Spoke with pharmacy and they are advised to only fill oxycodone and hold dilaudid til needed for kidney stones.

## 2018-11-19 NOTE — Progress Notes (Signed)
BP (!) 217/128   Pulse 85   Temp 99.2 F (37.3 C) (Oral)   Ht 6' (1.829 m)   Wt 233 lb (105.7 kg)   BMI 31.60 kg/m    Subjective:    Patient ID: Joseph Stafford, male    DOB: 25-May-1971, 47 y.o.   MRN: [id number]  HPI: Joseph IceWesley L Codispoti is a 47 y.o. male presenting on 11/17/2018 for Pain (3 month ) and Hypertension  This patient comes in for periodic recheck on medications and conditions including hypertension, degenerative disc disease and chronic kidney stones.  He is not having any other issues at this time.  Refills to be given..   All medications are reviewed today. There are no reports of any problems with the medications. All of the medical conditions are reviewed and updated.  Lab work is reviewed and will be ordered as medically necessary. There are no new problems reported with today's visit.   Past Medical History:  Diagnosis Date  . Chronic back pain   . Hypertension   . Thyroid disease    Relevant past medical, surgical, family and social history reviewed and updated as indicated. Interim medical history since our last visit reviewed. Allergies and medications reviewed and updated. DATA REVIEWED: CHART IN EPIC  Family History reviewed for pertinent findings.  Review of Systems  Constitutional: Negative.  Negative for appetite change and fatigue.  Eyes: Negative for pain and visual disturbance.  Respiratory: Negative.  Negative for cough, chest tightness, shortness of breath and wheezing.   Cardiovascular: Negative.  Negative for chest pain, palpitations and leg swelling.  Gastrointestinal: Negative.  Negative for abdominal pain, diarrhea, nausea and vomiting.  Genitourinary: Negative.   Musculoskeletal: Positive for arthralgias, back pain and myalgias.  Skin: Negative.  Negative for color change and rash.  Neurological: Negative.  Negative for weakness, numbness and headaches.  Psychiatric/Behavioral: Negative.     Allergies as of 11/17/2018      Reactions     Penicillins Nausea Only   Ivp Dye [iodinated Diagnostic Agents]       Medication List       Accurate as of November 17, 2018 11:59 PM. Always use your most recent med list.        amLODipine 10 MG tablet Commonly known as:  NORVASC Take 1 tablet (10 mg total) by mouth daily.   COMBIVENT RESPIMAT 20-100 MCG/ACT Aers respimat Generic drug:  Ipratropium-Albuterol Inhale 1 puff into the lungs every 6 (six) hours as needed.   gabapentin 300 MG capsule Commonly known as:  NEURONTIN Take 1-2 capsules (300-600 mg total) by mouth 3 (three) times daily.   HYDROmorphone 4 MG tablet Commonly known as:  DILAUDID Take 1 tablet (4 mg total) by mouth every 6 (six) hours as needed for severe pain (kidney stones).   levothyroxine 75 MCG tablet Commonly known as:  SYNTHROID, LEVOTHROID Take 1 tablet (75 mcg total) by mouth every morning.   lisinopril 20 MG tablet Commonly known as:  PRINIVIL,ZESTRIL Take 1 tablet (20 mg total) by mouth daily.   oxycodone 30 MG immediate release tablet Commonly known as:  ROXICODONE Take 1-2 tablets (30-60 mg total) by mouth every 6 (six) hours as needed for pain.   oxycodone 30 MG immediate release tablet Commonly known as:  ROXICODONE Take 1-2 tablets (30-60 mg total) by mouth every 6 (six) hours as needed for pain.   oxycodone 30 MG immediate release tablet Commonly known as:  ROXICODONE Take 1-2 tablets (30-60 mg  total) by mouth every 6 (six) hours as needed for pain.   spironolactone 25 MG tablet Commonly known as:  ALDACTONE Take 1 tablet (25 mg total) by mouth daily.          Objective:    BP (!) 217/128   Pulse 85   Temp 99.2 F (37.3 C) (Oral)   Ht 6' (1.829 m)   Wt 233 lb (105.7 kg)   BMI 31.60 kg/m   Allergies  Allergen Reactions  . Penicillins Nausea Only  . Ivp Dye [Iodinated Diagnostic Agents]     Wt Readings from Last 3 Encounters:  11/17/18 233 lb (105.7 kg)  09/08/18 213 lb 6.4 oz (96.8 kg)  07/19/18 216 lb  (98 kg)    Physical Exam Constitutional:      Appearance: He is well-developed.  HENT:     Head: Normocephalic and atraumatic.  Eyes:     Conjunctiva/sclera: Conjunctivae normal.     Pupils: Pupils are equal, round, and reactive to light.  Neck:     Musculoskeletal: Normal range of motion and neck supple.  Cardiovascular:     Rate and Rhythm: Normal rate and regular rhythm.     Heart sounds: Normal heart sounds.  Pulmonary:     Effort: Pulmonary effort is normal.     Breath sounds: Normal breath sounds.  Abdominal:     General: Bowel sounds are normal.     Palpations: Abdomen is soft.  Musculoskeletal: Normal range of motion.  Skin:    General: Skin is warm and dry.         Assessment & Plan:   1. Essential hypertension - amLODipine (NORVASC) 10 MG tablet; Take 1 tablet (10 mg total) by mouth daily.  Dispense: 90 tablet; Refill: 1 - lisinopril (PRINIVIL,ZESTRIL) 20 MG tablet; Take 1 tablet (20 mg total) by mouth daily.  Dispense: 90 tablet; Refill: 3  2. DDD (degenerative disc disease), lumbar - oxycodone (ROXICODONE) 30 MG immediate release tablet; Take 1-2 tablets (30-60 mg total) by mouth every 6 (six) hours as needed for pain.  Dispense: 240 tablet; Refill: 0 - oxycodone (ROXICODONE) 30 MG immediate release tablet; Take 1-2 tablets (30-60 mg total) by mouth every 6 (six) hours as needed for pain.  Dispense: 240 tablet; Refill: 0 - oxycodone (ROXICODONE) 30 MG immediate release tablet; Take 1-2 tablets (30-60 mg total) by mouth every 6 (six) hours as needed for pain.  Dispense: 240 tablet; Refill: 0  3. Kidney stones - HYDROmorphone (DILAUDID) 4 MG tablet; Take 1 tablet (4 mg total) by mouth every 6 (six) hours as needed for severe pain (kidney stones).  Dispense: 30 tablet; Refill: 0   Continue all other maintenance medications as listed above.  Follow up plan: Return in about 3 months (around 02/16/2019).  Educational handout given for survey  Remus LofflerAngel S. Tarrell Debes  PA-C [facility] [place] [facility] [address]  [place], [place] [id number] [phone]   11/19/2018, 10:34 PM

## 2018-11-20 ENCOUNTER — Ambulatory Visit: Payer: Self-pay | Admitting: Physician Assistant

## 2018-11-20 ENCOUNTER — Telehealth: Payer: Self-pay | Admitting: Physician Assistant

## 2018-11-24 ENCOUNTER — Telehealth: Payer: Self-pay | Admitting: Physician Assistant

## 2018-11-24 NOTE — Telephone Encounter (Signed)
Patient aware that medication was sent to pharmacy and per the policy they would not fill the Dilaudid and Hydrocodone at the same time.

## 2018-11-24 NOTE — Telephone Encounter (Signed)
Pharmacy [facility] [place]   Pt states that when he urinates it burns and he uses the HYDROmorphone (DILAUDID) 4 MG tablet to help with his kidney pain, wants to know if we can send to pharmacy, he leaves Sunday [facility] [place] [facility]

## 2018-11-24 NOTE — Telephone Encounter (Signed)
Left message to call back.  [facility] by Prudy FeelerAngel Jones.

## 2018-12-09 ENCOUNTER — Other Ambulatory Visit: Payer: Self-pay | Admitting: Physician Assistant

## 2018-12-15 ENCOUNTER — Telehealth: Payer: Self-pay | Admitting: Physician Assistant

## 2018-12-15 NOTE — Telephone Encounter (Signed)
Pt states that he is going out of state tomorrow and needs pain med refilled today - rx says fill the 19th.  Would like to fill today.. Please address and I will call [facility].

## 2018-12-15 NOTE — Telephone Encounter (Signed)
[facility] aware[facility]  

## 2018-12-15 NOTE — Telephone Encounter (Signed)
Okay to fill? 

## 2019-02-14 ENCOUNTER — Other Ambulatory Visit: Payer: Self-pay | Admitting: Physician Assistant

## 2019-02-20 ENCOUNTER — Telehealth: Payer: Self-pay | Admitting: Physician Assistant

## 2019-02-26 ENCOUNTER — Telehealth: Payer: Self-pay | Admitting: Physician Assistant

## 2019-02-27 ENCOUNTER — Other Ambulatory Visit: Payer: Self-pay | Admitting: Physician Assistant

## 2019-03-13 NOTE — Progress Notes (Signed)
BP (!) 205/115   Pulse 80   Temp (!) 97.1 F (36.2 C) (Oral)   Ht 6' (1.829 m)   Wt 212 lb 12.8 oz (96.5 kg)   BMI 28.86 kg/m    Subjective:    Patient ID: Joseph Stafford, male    DOB: 30-Nov-1970, 48 y.o.   MRN: [id number]  HPI: Joseph Stafford is a 48 y.o. male presenting on 03/14/2019 for Recurrent Skin Infections (right forearm)  This patient comes in for periodic recheck on his medical conditions.  He is currently dealing with infection on his right forearm.  He does frequently get abscesses like this.  We discussed the possibility of him be a staph and that we he needs to get these treated as soon as possible.  Antibiotic will be sent to the pharmacy  He is dealing with his hypertension, hypothyroidism.  His blood pressure has been quite elevated.  We have discussed the changes that we need to go.  We will plan to recheck him in about 4 months on this.  We will also have labs performed today  As far as his chronic narcotic treatment at our office, we cannot cannot perform narcotic treatment for this patient anymore due to non-compliacne and high risk behavior.  He did not return the appointment till time, they would be early for his medications, he was persistent and escalating his quantity, and there were only going concerns of recent DUI and previous overdoses as documented in the hospital record.  I have given him a long list of psychiatric contacts and places that he might want to work on rehabilitation and his addiction issues.  I have also talked to him about going to a pain clinic because I do believe he has pain however these are going against each other at this time and he needs to walk a fine line.  Past Medical History:  Diagnosis Date  . Chronic back pain   . Hypertension   . Thyroid disease    Relevant past medical, surgical, family and social history reviewed and updated as indicated. Interim medical history since our last visit reviewed. Allergies and medications  reviewed and updated. DATA REVIEWED: CHART IN EPIC  Family History reviewed for pertinent findings.  Review of Systems  Constitutional: Positive for fatigue. Negative for appetite change.  HENT: Negative.   Eyes: Negative.  Negative for pain and visual disturbance.  Respiratory: Negative.  Negative for cough, chest tightness, shortness of breath and wheezing.   Cardiovascular: Negative.  Negative for chest pain, palpitations and leg swelling.  Gastrointestinal: Negative.  Negative for abdominal pain, diarrhea, nausea and vomiting.  Endocrine: Negative.   Genitourinary: Negative.   Musculoskeletal: Positive for arthralgias, back pain and myalgias.  Skin: Negative.  Negative for color change and rash.  Neurological: Negative.  Negative for weakness, numbness and headaches.  Psychiatric/Behavioral: Negative for sleep disturbance and suicidal ideas. The patient is nervous/anxious.     Allergies as of 03/14/2019      Reactions   Penicillins Nausea Only   Ivp Dye [iodinated Diagnostic Agents]       Medication List       Accurate as of March 14, 2019 11:27 AM. Always use your most recent med list.        amLODipine 10 MG tablet Commonly known as:  NORVASC Take 1 tablet (10 mg total) by mouth daily.   gabapentin 800 MG tablet Commonly known as:  Neurontin Take 1 tablet (800 mg total) by  mouth 3 (three) times daily.   levothyroxine 75 MCG tablet Commonly known as:  SYNTHROID, LEVOTHROID Take 1 tablet (75 mcg total) by mouth every morning.   lisinopril 20 MG tablet Commonly known as:  PRINIVIL,ZESTRIL Take 1 tablet (20 mg total) by mouth daily.   spironolactone 50 MG tablet Commonly known as:  ALDACTONE Take 1 tablet (50 mg total) by mouth daily.   sulfamethoxazole-trimethoprim 800-160 MG tablet Commonly known as:  Bactrim DS Take 1 tablet by mouth 2 (two) times daily.          Objective:    BP (!) 205/115   Pulse 80   Temp (!) 97.1 F (36.2 C) (Oral)   Ht 6'  (1.829 m)   Wt 212 lb 12.8 oz (96.5 kg)   BMI 28.86 kg/m   Allergies  Allergen Reactions  . Penicillins Nausea Only  . Ivp Dye [Iodinated Diagnostic Agents]     Wt Readings from Last 3 Encounters:  03/14/19 212 lb 12.8 oz (96.5 kg)  11/17/18 233 lb (105.7 kg)  09/08/18 213 lb 6.4 oz (96.8 kg)    Physical Exam Vitals signs and nursing note reviewed.  Constitutional:      General: He is not in acute distress.    Appearance: He is well-developed.  HENT:     Head: Normocephalic and atraumatic.  Eyes:     Conjunctiva/sclera: Conjunctivae normal.     Pupils: Pupils are equal, round, and reactive to light.  Cardiovascular:     Rate and Rhythm: Normal rate and regular rhythm.     Heart sounds: Normal heart sounds.  Pulmonary:     Effort: Pulmonary effort is normal. No respiratory distress.     Breath sounds: Normal breath sounds.  Skin:    General: Skin is warm and dry.  Psychiatric:        Attention and Perception: Attention normal.        Mood and Affect: Mood is anxious. Affect is flat. Affect is not inappropriate.        Speech: Speech normal.        Behavior: Behavior normal.        Thought Content: Thought content normal.         Assessment & Plan:   1. Essential hypertension - spironolactone (ALDACTONE) 50 MG tablet; Take 1 tablet (50 mg total) by mouth daily.  Dispense: 30 tablet; Refill: 5 - lisinopril (PRINIVIL,ZESTRIL) 20 MG tablet; Take 1 tablet (20 mg total) by mouth daily.  Dispense: 30 tablet; Refill: 5 - amLODipine (NORVASC) 10 MG tablet; Take 1 tablet (10 mg total) by mouth daily.  Dispense: 30 tablet; Refill: 5 - CMP14+EGFR  2. Sexually transmitted disease exposure - STD Screen (8) - Chlamydia/Gonococcus/Trichomonas, NAA  3. DDD (degenerative disc disease), lumbar - Ambulatory referral to Pain Clinic  4. Acquired hypothyroidism - levothyroxine (SYNTHROID, LEVOTHROID) 75 MCG tablet; Take 1 tablet (75 mcg total) by mouth every morning.  Dispense: 30  tablet; Refill: 5 - TSH  5. Chronic pain syndrome - gabapentin (NEURONTIN) 800 MG tablet; Take 1 tablet (800 mg total) by mouth 3 (three) times daily.  Dispense: 90 tablet; Refill: 5 - Ambulatory referral to Pain Clinic   Continue all other maintenance medications as listed above.  Follow up plan: Follow up in 4 months  Educational handout given for [place] PA-C [facility] [address]  [place], [place] [address] [phone]   03/14/2019, 11:27 AM

## 2019-03-14 ENCOUNTER — Encounter: Payer: Self-pay | Admitting: Physician Assistant

## 2019-03-14 ENCOUNTER — Other Ambulatory Visit: Payer: Self-pay

## 2019-03-14 ENCOUNTER — Ambulatory Visit (INDEPENDENT_AMBULATORY_CARE_PROVIDER_SITE_OTHER): Payer: Self-pay | Admitting: Physician Assistant

## 2019-03-14 VITALS — BP 205/115 | HR 80 | Temp 97.1°F | Ht 72.0 in | Wt 212.8 lb

## 2019-03-14 DIAGNOSIS — M5136 Other intervertebral disc degeneration, lumbar region: Secondary | ICD-10-CM

## 2019-03-14 DIAGNOSIS — I10 Essential (primary) hypertension: Secondary | ICD-10-CM

## 2019-03-14 DIAGNOSIS — E039 Hypothyroidism, unspecified: Secondary | ICD-10-CM

## 2019-03-14 DIAGNOSIS — Z202 Contact with and (suspected) exposure to infections with a predominantly sexual mode of transmission: Secondary | ICD-10-CM

## 2019-03-14 DIAGNOSIS — G894 Chronic pain syndrome: Secondary | ICD-10-CM

## 2019-03-14 MED ORDER — AMLODIPINE BESYLATE 10 MG PO TABS: 10.0000 mg | ORAL_TABLET | Freq: Every day | ORAL | 5 refills | Status: AC

## 2019-03-14 MED ORDER — SULFAMETHOXAZOLE-TRIMETHOPRIM 800-160 MG PO TABS: 1.0000 | ORAL_TABLET | Freq: Two times a day (BID) | ORAL | 9 refills | Status: DC

## 2019-03-14 MED ORDER — SPIRONOLACTONE 50 MG PO TABS: 50.0000 mg | ORAL_TABLET | Freq: Every day | ORAL | 5 refills | Status: AC

## 2019-03-14 MED ORDER — GABAPENTIN 800 MG PO TABS
800.0000 mg | ORAL_TABLET | Freq: Three times a day (TID) | ORAL | 5 refills | Status: DC
Start: 2019-03-14 — End: 2019-04-06

## 2019-03-14 MED ORDER — LISINOPRIL 20 MG PO TABS: 20.0000 mg | ORAL_TABLET | Freq: Every day | ORAL | 5 refills | Status: AC

## 2019-03-14 MED ORDER — LEVOTHYROXINE SODIUM 75 MCG PO TABS: 75.0000 ug | ORAL_TABLET | Freq: Every morning | ORAL | 5 refills | Status: AC

## 2019-03-15 ENCOUNTER — Other Ambulatory Visit: Payer: Self-pay | Admitting: Physician Assistant

## 2019-03-15 ENCOUNTER — Other Ambulatory Visit: Payer: Self-pay | Admitting: *Deleted

## 2019-03-15 DIAGNOSIS — B159 Hepatitis A without hepatic coma: Secondary | ICD-10-CM

## 2019-03-15 DIAGNOSIS — R768 Other specified abnormal immunological findings in serum: Secondary | ICD-10-CM

## 2019-03-15 LAB — STD SCREEN (8)
HIV Screen 4th Generation wRfx: NONREACTIVE
HSV 1 Glycoprotein G Ab, IgG: 20.6 index — ABNORMAL HIGH (ref 0.00–0.90)
HSV 2 IgG, Type Spec: <0.91 index (ref 0.00–0.90)
Hep A IgM: POSITIVE — AB
Hep B C IgM: NEGATIVE
Hep C Virus Ab: >11 s/co ratio — ABNORMAL HIGH (ref 0.0–0.9)
Hepatitis B Surface Ag: NEGATIVE
RPR Ser Ql: NONREACTIVE

## 2019-03-15 LAB — CHLAMYDIA/GONOCOCCUS/TRICHOMONAS, NAA
Chlamydia by NAA: NEGATIVE
Gonococcus by NAA: NEGATIVE
Trich vag by NAA: NEGATIVE

## 2019-03-15 LAB — CMP14+EGFR
ALT: 60 IU/L — ABNORMAL HIGH (ref 0–44)
AST: 49 IU/L — ABNORMAL HIGH (ref 0–40)
Albumin/Globulin Ratio: 1 — ABNORMAL LOW (ref 1.2–2.2)
Albumin: 4.2 g/dL (ref 4.0–5.0)
Alkaline Phosphatase: 118 IU/L — ABNORMAL HIGH (ref 39–117)
BUN/Creatinine Ratio: 15 (ref 9–20)
BUN: 12 mg/dL (ref 6–24)
Bilirubin Total: 0.3 mg/dL (ref 0.0–1.2)
CO2: 24 mmol/L (ref 20–29)
Calcium: 9.5 mg/dL (ref 8.7–10.2)
Chloride: 101 mmol/L (ref 96–106)
Creatinine, Ser: 0.82 mg/dL (ref 0.76–1.27)
GFR calc Af Amer: 123 mL/min/{1.73_m2} (ref >59)
GFR calc non Af Amer: 107 mL/min/{1.73_m2} (ref >59)
Globulin, Total: 4.2 g/dL (ref 1.5–4.5)
Glucose: 85 mg/dL (ref 65–99)
Potassium: 4.5 mmol/L (ref 3.5–5.2)
Sodium: 143 mmol/L (ref 134–144)
Total Protein: 8.4 g/dL (ref 6.0–8.5)

## 2019-03-15 LAB — TSH: TSH: 1.56 u[IU]/mL (ref 0.450–4.500)

## 2019-03-15 NOTE — Telephone Encounter (Signed)
Patient aware of results and verbalizes understanding. Patient name, dob and address were verfified.

## 2019-03-16 ENCOUNTER — Telehealth: Payer: Self-pay

## 2019-03-16 NOTE — Telephone Encounter (Signed)
If there are any other clinics that you want to try it is fine with me.

## 2019-03-16 NOTE — Telephone Encounter (Signed)
FYI, you had placed a referral for this patient to be referred to [facility].  I sent over the referral for this and they have declined to accept him.

## 2019-03-19 NOTE — Telephone Encounter (Signed)
I have sent his information over to Dr. Faith Rogue for review.

## 2019-03-22 NOTE — Progress Notes (Signed)
completed

## 2019-04-02 ENCOUNTER — Other Ambulatory Visit: Payer: Self-pay | Admitting: Physician Assistant

## 2019-04-05 ENCOUNTER — Other Ambulatory Visit: Payer: Self-pay | Admitting: Physician Assistant

## 2019-04-05 DIAGNOSIS — G894 Chronic pain syndrome: Secondary | ICD-10-CM

## 2019-05-01 ENCOUNTER — Ambulatory Visit (INDEPENDENT_AMBULATORY_CARE_PROVIDER_SITE_OTHER): Payer: Self-pay | Admitting: Physician Assistant

## 2019-05-01 ENCOUNTER — Encounter: Payer: Self-pay | Admitting: Physician Assistant

## 2019-05-01 ENCOUNTER — Other Ambulatory Visit: Payer: Self-pay

## 2019-05-01 VITALS — BP 218/116 | HR 90 | Temp 96.9°F | Ht 72.0 in | Wt 225.0 lb

## 2019-05-01 DIAGNOSIS — R41840 Attention and concentration deficit: Secondary | ICD-10-CM

## 2019-05-01 DIAGNOSIS — I10 Essential (primary) hypertension: Secondary | ICD-10-CM

## 2019-05-01 DIAGNOSIS — M5136 Other intervertebral disc degeneration, lumbar region: Secondary | ICD-10-CM

## 2019-05-01 DIAGNOSIS — G894 Chronic pain syndrome: Secondary | ICD-10-CM

## 2019-05-01 DIAGNOSIS — Z72820 Sleep deprivation: Secondary | ICD-10-CM

## 2019-05-01 MED ORDER — QUETIAPINE FUMARATE 100 MG PO TABS: 100.0000 mg | ORAL_TABLET | Freq: Every day | ORAL | 5 refills | Status: DC

## 2019-05-01 MED ORDER — GABAPENTIN 800 MG PO TABS: 800.0000 mg | ORAL_TABLET | Freq: Three times a day (TID) | ORAL | 3 refills | Status: DC

## 2019-05-01 NOTE — Patient Instructions (Signed)
Your provider wants you to schedule an appointment with a Psychologist/Psychiatrist. The following list of offices requires the patient to call and make their own appointment, as there is information they need that only you can provide. Please feel free to choose form the following providers:  [facility]   [phone] Crisis Recovery in [place] [phone]  [facility]  [phone]   [address] [place], [place]  (Scheduled through [facility]) Must call and do an interview for appointment. Sees Children / Accepts Medicaid  [facility]    [phone]  [address]    [place], [place]       [facility]  [phone] [address] [place], [place]  Evaluates for Autism but does not treat it Sees Children / Accepts Medicaid  [facility]    [phone] [address]   [place], [place] Medication management, substance abuse, bipolar, grief, family, marriage, OCD, anxiety, PTSD Sees children / Accepts Medicaid  [facility]    [phone] [address] [place], [place] Sees children / Accepts Medicaid  [facility]  [phone] [address] [place], [place]   Dr Akinlayo     [phone] [address] [place] [address] [place], [place]  Sees ADD & ADHD for treatment Accepts Medicaid  [facility]  [phone] [address] [place], [place] Evaluates for Autism Accepts Medicaid  [facility]  [phone] [address] [place], [place]  Does Adult ADD evaluations Does not accept Medicaid  [facility]   [phone] [address]   [place], [place] Uses animal therapy  Sees children as young as 3 years old Accepts Medicaid  [facility]     [phone]    [address]  ,  [address] Sees children Accepts Medicaid  

## 2019-05-07 ENCOUNTER — Encounter: Payer: Self-pay | Admitting: Physician Assistant

## 2019-05-07 NOTE — Progress Notes (Signed)
BP (!) 218/116   Pulse 90   Temp (!) 96.9 F (36.1 C) (Oral)   Ht 6' (1.829 m)   Wt 225 lb (102.1 kg)   BMI 30.52 kg/m    Subjective:    Patient ID: Joseph Stafford, male    DOB: 28-Nov-1971, 48 y.o.   MRN: [id number]  HPI: Joseph Stafford is a 48 y.o. male presenting on 05/01/2019 for Hypertension  This patient comes in for follow-up on his hypertension and degenerative disc disease.  He was discontinued from all of his medications a couple of months ago.  He states that he still does have a lot of pain but is not having a severe problem at this time.  He is not able to go to pain management because he has no insurance.  He states this is not going to change anytime soon.  He had seen blood pressure readings at home anywhere from 130-140 over 90s.  He denies any chest pain or shortness of breath.  He states he does have a lot of difficulty with concentration and very poor sleep.  I have encouraged him to try to go to psychiatry to be evaluated for psychiatric disorders.  He has taken Seroquel in the past.  I think that would be an acceptable medication to try for sleep.  Past Medical History:  Diagnosis Date  . Chronic back pain   . Hypertension   . Thyroid disease    Relevant past medical, surgical, family and social history reviewed and updated as indicated. Interim medical history since our last visit reviewed. Allergies and medications reviewed and updated. DATA REVIEWED: CHART IN EPIC  Family History reviewed for pertinent findings.  Review of Systems  Constitutional: Negative.  Negative for appetite change and fatigue.  Eyes: Negative for pain and visual disturbance.  Respiratory: Negative.  Negative for cough, chest tightness, shortness of breath and wheezing.   Cardiovascular: Negative.  Negative for chest pain, palpitations and leg swelling.  Gastrointestinal: Negative.  Negative for abdominal pain, diarrhea, nausea and vomiting.  Genitourinary: Negative.    Musculoskeletal: Positive for arthralgias and gait problem.  Skin: Negative.  Negative for color change and rash.  Neurological: Negative for weakness, numbness and headaches.  Psychiatric/Behavioral: Positive for decreased concentration, dysphoric mood and sleep disturbance. Negative for suicidal ideas.    Allergies as of 05/01/2019      Reactions   Penicillins Nausea Only   Ivp Dye [iodinated Diagnostic Agents]       Medication List       Accurate as of May 01, 2019 11:59 PM. If you have any questions, ask your nurse or doctor.        STOP taking these medications   sulfamethoxazole-trimethoprim 800-160 MG tablet Commonly known as:  Bactrim DS Stopped by:  Terald Sleeper, PA-C     TAKE these medications   amLODipine 10 MG tablet Commonly known as:  NORVASC Take 1 tablet (10 mg total) by mouth daily.   gabapentin 800 MG tablet Commonly known as:  NEURONTIN Take 1 tablet (800 mg total) by mouth 3 (three) times daily.   levothyroxine 75 MCG tablet Commonly known as:  SYNTHROID Take 1 tablet (75 mcg total) by mouth every morning.   lisinopril 20 MG tablet Commonly known as:  ZESTRIL Take 1 tablet (20 mg total) by mouth daily.   QUEtiapine 100 MG tablet Commonly known as:  SEROquel Take 1-2 tablets (100-200 mg total) by mouth at bedtime. Started by:  Terald Sleeper, PA-C   spironolactone 50 MG tablet Commonly known as:  ALDACTONE Take 1 tablet (50 mg total) by mouth daily.          Objective:    BP (!) 218/116   Pulse 90   Temp (!) 96.9 F (36.1 C) (Oral)   Ht 6' (1.829 m)   Wt 225 lb (102.1 kg)   BMI 30.52 kg/m   Allergies  Allergen Reactions  . Penicillins Nausea Only  . Ivp Dye [Iodinated Diagnostic Agents]     Wt Readings from Last 3 Encounters:  05/01/19 225 lb (102.1 kg)  03/14/19 212 lb 12.8 oz (96.5 kg)  11/17/18 233 lb (105.7 kg)    Physical Exam Vitals signs and nursing note reviewed.  Constitutional:      General: He is not in acute  distress.    Appearance: He is well-developed.  HENT:     Head: Normocephalic and atraumatic.  Eyes:     Conjunctiva/sclera: Conjunctivae normal.     Pupils: Pupils are equal, round, and reactive to light.  Cardiovascular:     Rate and Rhythm: Normal rate and regular rhythm.     Heart sounds: Normal heart sounds.  Pulmonary:     Effort: Pulmonary effort is normal. No respiratory distress.     Breath sounds: Normal breath sounds.  Skin:    General: Skin is warm and dry.     Results for orders placed or performed in visit on 03/14/19  Chlamydia/Gonococcus/Trichomonas, NAA  Result Value Ref Range   Chlamydia by NAA Negative Negative   Gonococcus by NAA Negative Negative   Trich vag by NAA Negative Negative  STD Screen (8)  Result Value Ref Range   Hep A IgM Positive (A) Negative   Hepatitis B Surface Ag Negative Negative   Hep B C IgM Negative Negative   Hep C Virus Ab >11.0 (H) 0.0 - 0.9 s/co ratio   RPR Ser Ql Non Reactive Non Reactive   HIV Screen 4th Generation wRfx Non Reactive Non Reactive   HSV 1 Glycoprotein G Ab, IgG 20.60 (H) 0.00 - 0.90 index   HSV 2 IgG, Type Spec <0.91 0.00 - 0.90 index  CMP14+EGFR  Result Value Ref Range   Glucose 85 65 - 99 mg/dL   BUN 12 6 - 24 mg/dL   Creatinine, Ser 0.82 0.76 - 1.27 mg/dL   GFR calc non Af Amer 107 >59 mL/min/1.73   GFR calc Af Amer 123 >59 mL/min/1.73   BUN/Creatinine Ratio 15 9 - 20   Sodium 143 134 - 144 mmol/L   Potassium 4.5 3.5 - 5.2 mmol/L   Chloride 101 96 - 106 mmol/L   CO2 24 20 - 29 mmol/L   Calcium 9.5 8.7 - 10.2 mg/dL   Total Protein 8.4 6.0 - 8.5 g/dL   Albumin 4.2 4.0 - 5.0 g/dL   Globulin, Total 4.2 1.5 - 4.5 g/dL   Albumin/Globulin Ratio 1.0 (L) 1.2 - 2.2   Bilirubin Total 0.3 0.0 - 1.2 mg/dL   Alkaline Phosphatase 118 (H) 39 - 117 IU/L   AST 49 (H) 0 - 40 IU/L   ALT 60 (H) 0 - 44 IU/L  TSH  Result Value Ref Range   TSH 1.560 0.450 - 4.500 uIU/mL      Assessment & Plan:   1. Poor  concentration Make appointment with psychiatry  2. Essential hypertension Take medicines daily Keep a low-salt diet  3. DDD (degenerative disc disease), lumbar Continue  Neurontin   4. Poor sleep Seroquel 100 mg 1-2 at bedtime - QUEtiapine (SEROQUEL) 100 MG tablet; Take 1-2 tablets (100-200 mg total) by mouth at bedtime.  Dispense: 60 tablet; Refill: 5  5. Chronic pain syndrome - gabapentin (NEURONTIN) 800 MG tablet; Take 1 tablet (800 mg total) by mouth 3 (three) times daily.  Dispense: 270 tablet; Refill: 3   Continue all other maintenance medications as listed above.  Follow up plan: Return in about 3 months (around 08/01/2019).  Educational handout given for psychiatry  Terald Sleeper PA-C [place] [address]  [place], [place] [address] [phone]   05/07/2019, 12:42 PM

## 2019-05-18 ENCOUNTER — Other Ambulatory Visit: Payer: Self-pay

## 2019-05-18 ENCOUNTER — Inpatient Hospital Stay (HOSPITAL_COMMUNITY): Payer: Self-pay

## 2019-05-18 ENCOUNTER — Inpatient Hospital Stay (HOSPITAL_COMMUNITY)
Admission: AD | Admit: 2019-05-18 | Discharge: 2019-05-20 | DRG: 917 | Disposition: A | Discharge status: Home / Self Care | Payer: Self-pay | Source: Other Acute Inpatient Hospital | Attending: Internal Medicine | Admitting: Internal Medicine

## 2019-05-18 DIAGNOSIS — R0600 Dyspnea, unspecified: Secondary | ICD-10-CM

## 2019-05-18 DIAGNOSIS — Z8249 Family history of ischemic heart disease and other diseases of the circulatory system: Secondary | ICD-10-CM

## 2019-05-18 DIAGNOSIS — Z880 Allergy status to penicillin: Secondary | ICD-10-CM

## 2019-05-18 DIAGNOSIS — F1110 Opioid abuse, uncomplicated: Secondary | ICD-10-CM | POA: Diagnosis present

## 2019-05-18 DIAGNOSIS — F1210 Cannabis abuse, uncomplicated: Secondary | ICD-10-CM | POA: Diagnosis present

## 2019-05-18 DIAGNOSIS — G92 Toxic encephalopathy: Secondary | ICD-10-CM | POA: Diagnosis present

## 2019-05-18 DIAGNOSIS — M549 Dorsalgia, unspecified: Secondary | ICD-10-CM | POA: Diagnosis present

## 2019-05-18 DIAGNOSIS — Z79890 Hormone replacement therapy (postmenopausal): Secondary | ICD-10-CM

## 2019-05-18 DIAGNOSIS — I10 Essential (primary) hypertension: Secondary | ICD-10-CM | POA: Diagnosis present

## 2019-05-18 DIAGNOSIS — J690 Pneumonitis due to inhalation of food and vomit: Secondary | ICD-10-CM | POA: Diagnosis present

## 2019-05-18 DIAGNOSIS — G9341 Metabolic encephalopathy: Secondary | ICD-10-CM

## 2019-05-18 DIAGNOSIS — IMO0001 Reserved for inherently not codable concepts without codable children: Secondary | ICD-10-CM

## 2019-05-18 DIAGNOSIS — I248 Other forms of acute ischemic heart disease: Secondary | ICD-10-CM | POA: Diagnosis present

## 2019-05-18 DIAGNOSIS — Z20828 Contact with and (suspected) exposure to other viral communicable diseases: Secondary | ICD-10-CM | POA: Diagnosis present

## 2019-05-18 DIAGNOSIS — F17210 Nicotine dependence, cigarettes, uncomplicated: Secondary | ICD-10-CM | POA: Diagnosis present

## 2019-05-18 DIAGNOSIS — F1410 Cocaine abuse, uncomplicated: Secondary | ICD-10-CM | POA: Diagnosis present

## 2019-05-18 DIAGNOSIS — F1510 Other stimulant abuse, uncomplicated: Secondary | ICD-10-CM | POA: Diagnosis present

## 2019-05-18 DIAGNOSIS — R9389 Abnormal findings on diagnostic imaging of other specified body structures: Secondary | ICD-10-CM

## 2019-05-18 DIAGNOSIS — E039 Hypothyroidism, unspecified: Secondary | ICD-10-CM | POA: Diagnosis present

## 2019-05-18 DIAGNOSIS — F17200 Nicotine dependence, unspecified, uncomplicated: Secondary | ICD-10-CM

## 2019-05-18 DIAGNOSIS — T401X1A Poisoning by heroin, accidental (unintentional), initial encounter: Principal | ICD-10-CM

## 2019-05-18 DIAGNOSIS — J9601 Acute respiratory failure with hypoxia: Secondary | ICD-10-CM

## 2019-05-18 DIAGNOSIS — J9602 Acute respiratory failure with hypercapnia: Secondary | ICD-10-CM

## 2019-05-18 DIAGNOSIS — G8929 Other chronic pain: Secondary | ICD-10-CM | POA: Diagnosis present

## 2019-05-18 DIAGNOSIS — Z825 Family history of asthma and other chronic lower respiratory diseases: Secondary | ICD-10-CM

## 2019-05-18 DIAGNOSIS — Z79899 Other long term (current) drug therapy: Secondary | ICD-10-CM

## 2019-05-18 LAB — HEPATIC FUNCTION PANEL
ALT: 77 U/L — ABNORMAL HIGH (ref 0–44)
AST: 46 U/L — ABNORMAL HIGH (ref 15–41)
Albumin: 3.2 g/dL — ABNORMAL LOW (ref 3.5–5.0)
Alkaline Phosphatase: 61 U/L (ref 38–126)
Bilirubin, Direct: 0.2 mg/dL (ref 0.0–0.2)
Indirect Bilirubin: 0.4 mg/dL (ref 0.3–0.9)
Total Bilirubin: 0.6 mg/dL (ref 0.3–1.2)
Total Protein: 7.1 g/dL (ref 6.5–8.1)

## 2019-05-18 LAB — MAGNESIUM: Magnesium: 2.1 mg/dL (ref 1.7–2.4)

## 2019-05-18 LAB — CBC WITH DIFFERENTIAL/PLATELET
Abs Immature Granulocytes: 0.13 10*3/uL — ABNORMAL HIGH (ref 0.00–0.07)
Basophils Absolute: 0.1 10*3/uL (ref 0.0–0.1)
Basophils Relative: 0 %
Eosinophils Absolute: 0 10*3/uL (ref 0.0–0.5)
Eosinophils Relative: 0 %
HCT: 41.1 % (ref 39.0–52.0)
Hemoglobin: 13.3 g/dL (ref 13.0–17.0)
Immature Granulocytes: 1 %
Lymphocytes Relative: 5 %
Lymphs Abs: 1.1 10*3/uL (ref 0.7–4.0)
MCH: 28.2 pg (ref 26.0–34.0)
MCHC: 32.4 g/dL (ref 30.0–36.0)
MCV: 87.3 fL (ref 80.0–100.0)
Monocytes Absolute: 0.9 10*3/uL (ref 0.1–1.0)
Monocytes Relative: 4 %
Neutro Abs: 20.1 10*3/uL — ABNORMAL HIGH (ref 1.7–7.7)
Neutrophils Relative %: 90 %
Platelets: 210 10*3/uL (ref 150–400)
RBC: 4.71 MIL/uL (ref 4.22–5.81)
RDW: 14.9 % (ref 11.5–15.5)
WBC: 22.3 10*3/uL — ABNORMAL HIGH (ref 4.0–10.5)
nRBC: 0 % (ref 0.0–0.2)

## 2019-05-18 LAB — BASIC METABOLIC PANEL
Anion gap: 9 (ref 5–15)
BUN: 22 mg/dL — ABNORMAL HIGH (ref 6–20)
CO2: 25 mmol/L (ref 22–32)
Calcium: 8.1 mg/dL — ABNORMAL LOW (ref 8.9–10.3)
Chloride: 97 mmol/L — ABNORMAL LOW (ref 98–111)
Creatinine, Ser: 1.16 mg/dL (ref 0.61–1.24)
GFR calc Af Amer: >60 mL/min (ref >60)
GFR calc non Af Amer: >60 mL/min (ref >60)
Glucose, Bld: 94 mg/dL (ref 70–99)
Potassium: 3.9 mmol/L (ref 3.5–5.1)
Sodium: 131 mmol/L — ABNORMAL LOW (ref 135–145)

## 2019-05-18 LAB — ECHOCARDIOGRAM COMPLETE
Height: 72 in
Weight: 3587.33 oz

## 2019-05-18 LAB — TROPONIN I
Troponin I: 0.06 ng/mL (ref <0.03)
Troponin I: 0.05 ng/mL (ref <0.03)

## 2019-05-18 LAB — SARS CORONAVIRUS 2: SARS Coronavirus 2: NOT DETECTED

## 2019-05-18 LAB — MRSA PCR SCREENING: MRSA by PCR: NEGATIVE

## 2019-05-18 LAB — C-REACTIVE PROTEIN: CRP: 6.9 mg/dL — ABNORMAL HIGH (ref <1.0)

## 2019-05-18 LAB — BRAIN NATRIURETIC PEPTIDE: B Natriuretic Peptide: 80.5 pg/mL (ref 0.0–100.0)

## 2019-05-18 LAB — D-DIMER, QUANTITATIVE: D-Dimer, Quant: 0.56 ug/mL-FEU — ABNORMAL HIGH (ref 0.00–0.50)

## 2019-05-18 MED ORDER — ONDANSETRON HCL 4 MG PO TABS: 4.0000 mg | ORAL_TABLET | Freq: Four times a day (QID) | ORAL | Status: DC | PRN

## 2019-05-18 MED ORDER — IPRATROPIUM-ALBUTEROL 0.5-2.5 (3) MG/3ML IN SOLN: 3.0000 mL | Freq: Four times a day (QID) | RESPIRATORY_TRACT | Status: DC | PRN

## 2019-05-18 MED ORDER — NALOXONE HCL 0.4 MG/ML IJ SOLN: 0.4000 mg | INTRAMUSCULAR | Status: DC | PRN

## 2019-05-18 MED ORDER — CLINDAMYCIN PHOSPHATE 300 MG/50ML IV SOLN
300.0000 mg | Freq: Three times a day (TID) | INTRAVENOUS | Status: DC
  Administered 2019-05-18 – 2019-05-20 (×7): 300 mg via INTRAVENOUS
  Filled 2019-05-18 (×8): qty 50

## 2019-05-18 MED ORDER — LEVOTHYROXINE SODIUM 75 MCG PO TABS
75.0000 ug | ORAL_TABLET | Freq: Every day | ORAL | Status: DC
  Administered 2019-05-19 – 2019-05-20 (×2): 75 ug via ORAL
  Filled 2019-05-18 (×2): qty 1

## 2019-05-18 MED ORDER — CLONAZEPAM 0.25 MG PO TBDP
0.2500 mg | ORAL_TABLET | Freq: Two times a day (BID) | ORAL | Status: DC | PRN
  Administered 2019-05-18: 0.25 mg via ORAL
  Filled 2019-05-18: qty 1

## 2019-05-18 MED ORDER — ADULT MULTIVITAMIN W/MINERALS CH
1.0000 | ORAL_TABLET | Freq: Every day | ORAL | Status: DC
  Administered 2019-05-18 – 2019-05-20 (×3): 1 via ORAL
  Filled 2019-05-18 (×3): qty 1

## 2019-05-18 MED ORDER — CLONIDINE HCL 0.1 MG PO TABS
0.1000 mg | ORAL_TABLET | Freq: Three times a day (TID) | ORAL | Status: DC | PRN
  Administered 2019-05-18: 0.1 mg via ORAL
  Filled 2019-05-18: qty 1

## 2019-05-18 MED ORDER — ENOXAPARIN SODIUM 40 MG/0.4ML ~~LOC~~ SOLN
40.0000 mg | SUBCUTANEOUS | Status: DC
  Administered 2019-05-18 – 2019-05-20 (×3): 40 mg via SUBCUTANEOUS
  Filled 2019-05-18 (×3): qty 0.4

## 2019-05-18 MED ORDER — LISINOPRIL 20 MG PO TABS
20.0000 mg | ORAL_TABLET | Freq: Every day | ORAL | Status: DC
  Administered 2019-05-18 – 2019-05-20 (×3): 20 mg via ORAL
  Filled 2019-05-18 (×3): qty 1

## 2019-05-18 MED ORDER — AMLODIPINE BESYLATE 10 MG PO TABS
10.0000 mg | ORAL_TABLET | Freq: Every day | ORAL | Status: DC
  Administered 2019-05-18 – 2019-05-20 (×3): 10 mg via ORAL
  Filled 2019-05-18 (×3): qty 1

## 2019-05-18 MED ORDER — ORAL CARE MOUTH RINSE
15.0000 mL | Freq: Two times a day (BID) | OROMUCOSAL | Status: DC
  Administered 2019-05-18 – 2019-05-20 (×4): 15 mL via OROMUCOSAL

## 2019-05-18 MED ORDER — ONDANSETRON HCL 4 MG/2ML IJ SOLN: 4.0000 mg | Freq: Four times a day (QID) | INTRAMUSCULAR | Status: DC | PRN

## 2019-05-18 NOTE — H&P (Addendum)
History and Physical    DOA: 05/18/2019  PCP: Remus LofflerJones, Angel S, PA-C  [facility]  Chief Complaint: Unresponsiveness/heroin overdose  HPI: Joseph Stafford is a 48 y.o. male with history longstanding history of substance abuse including smoking and IV drug abuse: Marijuana, cocaine, methamphetamines and heroin.  He apparently was at his parents home and was found unresponsive last night.  EMS was summoned and he received 2 mg of Narcan at around 11 PM and repeat dose of 1 mg at 11:15 PM.  Patient [facility] and [facility].  He states, he used methamphetamine few weeks back but only heroin yesterday.  He apparently injected heroin intravenously through his right upper extremity.  He denies any recent cocaine or marijuana use.  He states he is compliant with his home medications usually.  He denies ever being on methadone or Suboxone but apparently was admitted to a rehab center ([facility]) more than 10 years back for cocaine addiction.  He denies any history of severe opiate withdrawals requiring hospitalizations.  He denies any history of seizures related to substance use. He apparently was hypoxic [facility] and required 4 L of O2.  His chest x-ray there showed patchy perihilar infiltrates raising possibility of pulmonary edema/infiltrates/ARDS.  He received 1 dose of Rocephin and azithromycin there.  He was transferred here for further work-up.  Call with screening test has been sent here but pending results currently.  Patient does report feeling short of breath and describes as tight feeling on taking a deep breath.  He states he has noticed some dyspnea on exertion over the last year but slightly worse overnight.  He does smoke 1 pack/day cigarettes. Patient denies any recent fevers.  Has some cough this morning which is new.  He was in jail about 3 months back but  denies any recent contacts  with known coronavirus patients.    Review of Systems: As per HPI otherwise 10 point review of systems negative.    Past Medical History:  Diagnosis Date  . Chronic back pain   . Hypertension   . Thyroid disease     Past Surgical History:  Procedure Laterality Date  . desmoid tumor removed      Social history: History of smoking and IV drug abuse as described in HPI.  Denies history of alcohol use.  Allergies  Allergen Reactions  . Penicillins Nausea Only  . Ivp Dye [Iodinated Diagnostic Agents]     Family History  Problem Relation Age of Onset  . COPD Mother   . Hypertension Father   . Cancer Brother       Prior to Admission medications   Medication Sig Start Date End Date Taking? Authorizing Provider  amLODipine (NORVASC) 10 MG tablet Take 1 tablet (10 mg total) by mouth daily. 03/14/19   Remus LofflerJones, Angel S, PA-C  gabapentin (NEURONTIN) 800 MG tablet Take 1 tablet (800 mg total) by mouth 3 (three) times daily. 05/01/19   Remus LofflerJones, Angel S, PA-C  levothyroxine (SYNTHROID, LEVOTHROID) 75 MCG tablet Take 1 tablet (75 mcg total) by mouth every morning. 03/14/19   Remus LofflerJones, Angel S, PA-C  lisinopril (PRINIVIL,ZESTRIL) 20 MG tablet Take 1 tablet (20 mg total) by mouth daily. 03/14/19   Remus LofflerJones, Angel S, PA-C  QUEtiapine (SEROQUEL) 100 MG tablet Take 1-2 tablets (100-200 mg total) by mouth at bedtime. 05/01/19   Remus LofflerJones, Angel S,  PA-C  spironolactone (ALDACTONE) 50 MG tablet Take 1 tablet (50 mg total) by mouth daily. 03/14/19   Remus LofflerJones, Angel S, PA-C    Physical Exam: Vitals:   05/18/19 0609 05/18/19 0800  BP: (!) 162/92 (!) 166/145  Pulse: 99 90  Resp: 20 20  Temp: 98.9 F (37.2 C) 98.2 F (36.8 C)  TempSrc: Oral Oral  SpO2: 94% 95%  Weight: 101.7 kg   Height: 6' (1.829 m)     Constitutional: NAD, calm, comfortable Eyes: PERRL, lids and conjunctivae normal ENMT: Mucous membranes are moist. Posterior pharynx clear of any exudate or lesions.Normal dentition.  Neck: normal, supple,  no masses, no thyromegaly Respiratory: Upper airway rhonchi which clear with cough.  Otherwise clear to auscultation bilaterally, no wheezing, no crackles. Normal respiratory effort. No accessory muscle use.  Cardiovascular: Regular rate and rhythm, tachycardic, no murmurs / rubs / gallops. No extremity edema. 2+ pedal pulses. No carotid bruits.  Abdomen: no tenderness, no masses palpated. No hepatosplenomegaly. Bowel sounds positive.  Musculoskeletal: no clubbing / cyanosis. No joint deformity upper and lower extremities. Good ROM, no contractures. Normal muscle tone.  Multiple venous injection sites on upper extremities noted Neurologic: CN 2-12 grossly intact. Sensation intact, DTR normal. Strength 5/5 in all 4.  Psychiatric: Normal judgment and insight. Alert and oriented x 3. Normal mood.  SKIN/catheters: no rashes, lesions, ulcers. No induration  Labs on Admission: I have personally reviewed following labs and imaging studies  CBC: Recent Labs  Lab 05/18/19 0833  WBC 22.3*  NEUTROABS 20.1*  HGB 13.3  HCT 41.1  MCV 87.3  PLT 210   Basic Metabolic Panel: No results for input(s): NA, K, CL, CO2, GLUCOSE, BUN, CREATININE, CALCIUM, MG, PHOS in the last 168 hours. GFR: CrCl cannot be calculated (Patient's most recent lab result is older than the maximum 21 days allowed.). Liver Function Tests: No results for input(s): AST, ALT, ALKPHOS, BILITOT, PROT, ALBUMIN in the last 168 hours. No results for input(s): LIPASE, AMYLASE in the last 168 hours. No results for input(s): AMMONIA in the last 168 hours. Coagulation Profile: No results for input(s): INR, PROTIME in the last 168 hours. Cardiac Enzymes: No results for input(s): CKTOTAL, CKMB, CKMBINDEX, TROPONINI in the last 168 hours. BNP (last 3 results) No results for input(s): PROBNP in the last 8760 hours. HbA1C: No results for input(s): HGBA1C in the last 72 hours. CBG: No results for input(s): GLUCAP in the last 168 hours.  Lipid Profile: No results for input(s): CHOL, HDL, LDLCALC, TRIG, CHOLHDL, LDLDIRECT in the last 72 hours. Thyroid Function Tests: No results for input(s): TSH, T4TOTAL, FREET4, T3FREE, THYROIDAB in the last 72 hours. Anemia Panel: No results for input(s): VITAMINB12, FOLATE, FERRITIN, TIBC, IRON, RETICCTPCT in the last 72 hours. Urine analysis: No results found for: COLORURINE, APPEARANCEUR, LABSPEC, PHURINE, GLUCOSEU, HGBUR, BILIRUBINUR, KETONESUR, PROTEINUR, UROBILINOGEN, NITRITE, LEUKOCYTESUR  Radiological Exams on Admission: No results found.  EKG: Independently reviewed.  Normal sinus rhythm with LVH pattern.  Prolonged QTc interval at 524 ms     Assessment and Plan:   1.  Toxic encephalopathy secondary to heroin overdose: Patient responded to 2 doses of Narcan (required total of 3 mg) by EMS.  Will have PRN Narcan available in case of recurrence.  Clonidine as needed for withdrawals.  Patient currently stable with no active signs of withdrawal.  His blood pressure is elevated but he has not got his morning meds yet.   2.  Acute hypoxic respiratory failure: Present on admission.  Patient was requiring 4 L of nasal cannula when transported here.  Currently has nasal cannula on side of the bed but able to speak full sentences without dyspnea and saturating 91 to 92% on room air.  Will repeat chest x-ray.  Will treat for possible aspiration pneumonitis/pneumonia in the setting of problem #1.  Patient is allergic to penicillin, will utilize clindamycin for now.  Rule out COVID-19.  Patient does have longstanding history of smoking, reports chronic dyspnea over a year and may have underlying COPD.  Duo nebs PRN  3.  Leukocytosis: Reactive versus related to problem #2.  CBC at outside facility showed WBC of 13 K earlier today.  Patient received IV Rocephin/azithromycin at outside facility.  Afebrile here.  Initiated IV clindamycin as above.  Given IV drug use, will send blood cultures.  Check  lactate level.  Repeat CBC in a.m.  4.  Prolonged QTC: In the setting of IV heroin use.  Avoid QT prolonging agents.  Hold quetiapine for now.  Repeat EKG in a.m.  Check magnesium level and keep greater than 2.  5.  Hypertension: Resume home medications including Norvasc and lisinopril.  Hold Aldactone for now.  Will utilize clonidine as needed for opiate withdrawals/ HTN.  2 g sodium diet  6.  Polysubstance abuse: Patient will need substance abuse referral prior to discharge.  Given IV drug use, will send screening serology for HIV, hepatitis B and hepatitis C.  Patient has poor IV access and currently nurse utilizing right EJ placed at outside facility  7.  Hypo-thyroidism: Resume Synthroid.  Check TSH in a.m.  8. Mild Troponemia: likely demand ischemia in the setting of heavy IVDA. No c/o chest pain currently but reports chest tightness.  Will cycle troponins as mildly elevated first set (0.02) at outside facility and check BNP given CXR findings. Will avoid IV fluids as patient awake and asking to eat. Will need stress test as outpatient given chronic c/o exertional dyspnea.  Check Echo.    DVT prophylaxis: Lovenox  Code Status: Full code  Family Communication: Discussed with patient. Health care proxy would be his mother Consults called: None Admission status: I certify that at the point of admission it is my clinical judgment that the patient will require inpatient hospital care spanning beyond 2 midnights from the point of admission due to high intensity of service and high frequency of surveillance required.Inpatient status is judged to be reasonable and necessary in order to provide the required intensity of service to ensure the patient's safety. The patient's presenting symptoms, physical exam findings, and initial radiographic and laboratory data in the context of their chronic comorbidities is felt to place them at high risk for further clinical deterioration.   Guilford Shi MD  [facility] Pager [phone]  If 7PM-7AM, please contact night-coverage [email or web address] Password [facility]  05/18/2019, 9:17 AM

## 2019-05-19 DIAGNOSIS — R0600 Dyspnea, unspecified: Secondary | ICD-10-CM

## 2019-05-19 LAB — CBC
HCT: 41.2 % (ref 39.0–52.0)
Hemoglobin: 13.5 g/dL (ref 13.0–17.0)
MCH: 28.2 pg (ref 26.0–34.0)
MCHC: 32.8 g/dL (ref 30.0–36.0)
MCV: 86.2 fL (ref 80.0–100.0)
Platelets: 226 10*3/uL (ref 150–400)
RBC: 4.78 MIL/uL (ref 4.22–5.81)
RDW: 14.8 % (ref 11.5–15.5)
WBC: 12 10*3/uL — ABNORMAL HIGH (ref 4.0–10.5)
nRBC: 0 % (ref 0.0–0.2)

## 2019-05-19 LAB — HIV ANTIBODY (ROUTINE TESTING W REFLEX): HIV Screen 4th Generation wRfx: NONREACTIVE

## 2019-05-19 LAB — LIPID PANEL
Cholesterol: 104 mg/dL (ref 0–200)
HDL: 32 mg/dL — ABNORMAL LOW (ref >40)
LDL Cholesterol: 57 mg/dL (ref 0–99)
Total CHOL/HDL Ratio: 3.3 RATIO
Triglycerides: 76 mg/dL (ref <150)
VLDL: 15 mg/dL (ref 0–40)

## 2019-05-19 LAB — HEPATITIS B SURFACE ANTIGEN: Hepatitis B Surface Ag: NEGATIVE

## 2019-05-19 LAB — HEPATITIS B CORE ANTIBODY, IGM: Hep B C IgM: NEGATIVE

## 2019-05-19 LAB — TSH: TSH: 2.667 u[IU]/mL (ref 0.350–4.500)

## 2019-05-19 LAB — HEPATITIS C ANTIBODY: HCV Ab: >11 s/co ratio — ABNORMAL HIGH (ref 0.0–0.9)

## 2019-05-19 NOTE — Progress Notes (Signed)
SATURATION QUALIFICATIONS: (This note is used to comply with regulatory documentation for home oxygen)  Patient Saturations on Room Air at Rest = 100%  Patient Saturations on Room Air while Ambulating = 98-100%  Patient Saturations on 0 Liters of oxygen while Ambulating = N/A  Please briefly explain why patient needs home oxygen: Patient does not qualify

## 2019-05-19 NOTE — Progress Notes (Signed)
CSW provided substance abuse resources to the patient. The CSW is signing off. If CSW is needed again, please re-consult.   Domenic Schwab, MSW, [place]

## 2019-05-19 NOTE — Progress Notes (Signed)
PROGRESS NOTE    Joseph Stafford  ZOX:[id number] DOB: 24-May-1971 DOA: 05/18/2019 PCP: Remus LofflerJones, Angel S, PA-C    Brief Narrative:  [age] y.o. male with history longstanding history of substance abuse including smoking and IV drug abuse: Marijuana, cocaine, methamphetamines and heroin.  He apparently was at his parents home and was found unresponsive last night.  EMS was summoned and he received 2 mg of Narcan at around 11 PM and repeat dose of 1 mg at 11:15 PM.  Patient remembers waking up and being [facility].  He states, he used methamphetamine few weeks back but only heroin yesterday.  He apparently injected heroin intravenously through his right upper extremity.  He denies any recent cocaine or marijuana use.  He states he is compliant with his home medications usually.  He denies ever being on methadone or Suboxone but apparently was admitted to a rehab center ([facility]) more than 10 years back for cocaine addiction.  He denies any history of severe opiate withdrawals requiring hospitalizations.  He denies any history of seizures related to substance use. He apparently was hypoxic on arrival to [facility] ED and required 4 L of O2.  His chest x-ray there showed patchy perihilar infiltrates raising possibility of pulmonary edema/infiltrates/ARDS.  He received 1 dose of Rocephin and azithromycin there.  He was transferred here for further work-up.  Call with screening test has been sent here but pending results currently.  Patient does report feeling short of breath and describes as tight feeling on taking a deep breath.  He states he has noticed some dyspnea on exertion over the last year but slightly worse overnight.  He does smoke 1 pack/day cigarettes. Patient denies any recent fevers.  Has some cough this morning which is new.  He was in jail about 3 months back but  denies any recent contacts with known coronavirus patients.    Assessment & Plan:   Active  Problems:   Heroin overdose (HCC)   Smoking   Acute respiratory failure with hypoxia    1.  Toxic encephalopathy secondary to heroin overdose: Patient responded to 2 doses of Narcan (required total of 3 mg) by EMS.  Continued with PRN Narcan available in case of recurrence.  Clonidine as needed for withdrawals.  Patient currently stable with no active signs of withdrawal. BP stable, albeit suboptimally controlled. -This AM, no longer encephalopathic  2.  Acute hypoxic respiratory failure secondary to aspiration PNA: Present on admission.  Patient was requiring 4 L of nasal cannula when transported here.   -now on minimal O2 support -Cont bronchodilator as needed -ambulated on room air  3.  Leukocytosis:  -Likely secondary to aspiration PNA. Pt not septic (afebrile, RR and HR stable) -WBC had worsened to peak of 22k, improved today but still elevated at 12k -Continue on IV clindamycin. Pt has hx of PCN allergy  4.  Prolonged QTC: In the setting of IV heroin use.  Avoid QT prolonging agents.  Hold quetiapine for now.   -Repeat EKG reviewed, resolved with qtc of 444 - repeat bmet in AM  5.  Hypertension: Resume home medications including Norvasc and lisinopril.  Hold Aldactone for now.  Will utilize clonidine as needed for opiate withdrawals/ HTN.   -Continue with 2 g sodium diet  6.  Polysubstance abuse:  -Known Hep C, confirmed on serologies -HIV neg -Cessation done at bedside. Pt agreeable to treatment program after d/c. Have consulted case manager for list  of resource programs  7.  Hypo-thyroidism:  -Continue with Synthroid as tolerated.   -TSH within normal limits at 2.667  8. Mild Troponemia:  -Trop slighly trending down -No chest pains -2d echo reviewed  DVT prophylaxis: Lovenox subQ Code Status: Full Family Communication: Pt in room, family not at bedside Disposition Plan: Uncertain at this time  Consultants:     Procedures:     Antimicrobials:  Anti-infectives (From admission, onward)   Start     Dose/Rate Route Frequency Ordered Stop   05/18/19 1000  clindamycin (CLEOCIN) IVPB 300 mg     300 mg 100 mL/hr over 30 Minutes Intravenous Every 8 hours 05/18/19 0916         Subjective: Feels better  Objective: Vitals:   05/18/19 1000 05/18/19 1654 05/18/19 2245 05/19/19 0901  BP:  139/77 (!) 152/81 (!) 147/101  Pulse:  79 80 81  Resp:  16 18   Temp:  98.7 F (37.1 C) 98.5 F (36.9 C) 97.9 F (36.6 C)  TempSrc:  Oral Oral Oral  SpO2: 90% 94% 96% 95%  Weight:      Height:        Intake/Output Summary (Last 24 hours) at 05/19/2019 1702 Last data filed at 05/19/2019 1100 Gross per 24 hour  Intake 750 ml  Output 600 ml  Net 150 ml   Filed Weights   05/18/19 0609  Weight: 101.7 kg    Examination:  General exam: Appears calm and comfortable  Respiratory system: Clear to auscultation. Respiratory effort normal. Cardiovascular system: S1 & S2 heard, RRR. Gastrointestinal system: Abdomen is nondistended, soft and nontender. No organomegaly or masses felt. Normal bowel sounds heard. Central nervous system: Alert and oriented. No focal neurological deficits. Extremities: Symmetric 5 x 5 power. Skin: No rashes, lesions Psychiatry: Judgement and insight appear normal. Mood & affect appropriate.   Data Reviewed: I have personally reviewed following labs and imaging studies  CBC: Recent Labs  Lab 05/18/19 0833 05/19/19 1112  WBC 22.3* 12.0*  NEUTROABS 20.1*  --   HGB 13.3 13.5  HCT 41.1 41.2  MCV 87.3 86.2  PLT 210 226   Basic Metabolic Panel: Recent Labs  Lab 05/18/19 0833 05/18/19 1031  NA 131*  --   K 3.9  --   CL 97*  --   CO2 25  --   GLUCOSE 94  --   BUN 22*  --   CREATININE 1.16  --   CALCIUM 8.1*  --   MG  --  2.1   GFR: Estimated Creatinine Clearance: 99.2 mL/min (by C-G formula based on SCr of 1.16 mg/dL). Liver Function Tests: Recent Labs  Lab 05/18/19 0833  AST 46*  ALT 77*   ALKPHOS 61  BILITOT 0.6  PROT 7.1  ALBUMIN 3.2*   No results for input(s): LIPASE, AMYLASE in the last 168 hours. No results for input(s): AMMONIA in the last 168 hours. Coagulation Profile: No results for input(s): INR, PROTIME in the last 168 hours. Cardiac Enzymes: Recent Labs  Lab 05/18/19 0833 05/18/19 1616  TROPONINI 0.06* 0.05*   BNP (last 3 results) No results for input(s): PROBNP in the last 8760 hours. HbA1C: No results for input(s): HGBA1C in the last 72 hours. CBG: No results for input(s): GLUCAP in the last 168 hours. Lipid Profile: Recent Labs    05/19/19 0802  CHOL 104  HDL 32*  LDLCALC 57  TRIG 76  CHOLHDL 3.3   Thyroid Function Tests: Recent Labs  05/19/19 0802  TSH 2.667   Anemia Panel: No results for input(s): VITAMINB12, FOLATE, FERRITIN, TIBC, IRON, RETICCTPCT in the last 72 hours. Sepsis Labs: No results for input(s): PROCALCITON, LATICACIDVEN in the last 168 hours.  Recent Results (from the past 240 hour(s))  MRSA PCR Screening     Status: None   Collection Time: 05/18/19  6:26 AM   Specimen: Nasal Mucosa; Nasopharyngeal  Result Value Ref Range Status   MRSA by PCR NEGATIVE NEGATIVE Final    Comment:        The GeneXpert MRSA Assay (FDA approved for NASAL specimens only), is one component of a comprehensive MRSA colonization surveillance program. It is not intended to diagnose MRSA infection nor to guide or monitor treatment for MRSA infections. Performed at [facility], [address] [place], [place] [address]   SARS Coronavirus 2     Status: None   Collection Time: 05/18/19  8:20 AM  Result Value Ref Range Status   SARS Coronavirus 2 NOT DETECTED NOT DETECTED Final    Comment: (NOTE) SARS-CoV-2 target nucleic acids are NOT DETECTED. The SARS-CoV-2 RNA is generally detectable in upper and lower respiratory specimens during the acute phase of infection.  Negative  results do not preclude SARS-CoV-2 infection, do  not rule out co-infections with other pathogens, and should not be used as the sole basis for treatment or other patient management decisions.  Negative results must be combined with clinical observations, patient history, and epidemiological information. The expected result is Not Detected. Fact Sheet for Patients: http://www.biofiredefense.com/wp-content/uploads/2020/03/BIOFIRE-COVID -19-patients.pdf Fact Sheet for Healthcare Providers: http://www.biofiredefense.com/wp-content/uploads/2020/03/BIOFIRE-COVID -19-hcp.pdf This test is not yet approved or cleared by the [place] FDA and  has been authorized for detection and/or diagnosis of SARS-CoV-2 by FDA under an Emergency Use Authorization (EUA).  This EUA will remain in effec t (meaning this test can be used) for the duration of  the COVID-19 declaration under Section 564(b)(1) of the Act, 21 U.S.C. section 360bbb-3(b)(1), unless the authorization is terminated or revoked sooner. Performed at [facility], [address] [place], [place] [address]   Culture, blood (routine x 2)     Status: None (Preliminary result)   Collection Time: 05/18/19  8:33 AM   Specimen: BLOOD RIGHT HAND  Result Value Ref Range Status   Specimen Description BLOOD RIGHT HAND  Final   Special Requests   Final    BOTTLES DRAWN AEROBIC AND ANAEROBIC Blood Culture adequate volume   Culture   Final    NO GROWTH 1 DAY Performed at [facility], [address] [place], [place] [address]    Report Status PENDING  Incomplete  Culture, blood (routine x 2)     Status: None (Preliminary result)   Collection Time: 05/18/19  8:39 AM   Specimen: BLOOD RIGHT HAND  Result Value Ref Range Status   Specimen Description BLOOD RIGHT HAND  Final   Special Requests   Final    BOTTLES DRAWN AEROBIC ONLY Blood Culture adequate volume   Culture   Final    NO GROWTH 1 DAY Performed at [facility], [address] [place], [place] [address]     Report Status PENDING  Incomplete     Radiology Studies: Portable Chest 1 View  Result Date: 05/18/2019 CLINICAL DATA:  Dyspnea, smoker, hypertension EXAM: PORTABLE CHEST 1 VIEW COMPARISON:  Portable exam 1111 hours compared to 06/28/2018 FINDINGS: Borderline enlargement of cardiac silhouette. Mediastinal contours normal. BILATERAL perihilar infiltrates  which could represent pulmonary edema or multifocal infection. Minimal central peribronchial thickening. No pleural effusion or pneumothorax. IMPRESSION: Bronchitic changes with BILATERAL perihilar infiltrates question edema versus infection. Electronically Signed   By: Lavonia Dana M.D.   On: 05/18/2019 12:50    Scheduled Meds: . amLODipine  10 mg Oral Daily  . enoxaparin (LOVENOX) injection  40 mg Subcutaneous Q24H  . levothyroxine  75 mcg Oral Q0600  . lisinopril  20 mg Oral Daily  . mouth rinse  15 mL Mouth Rinse BID  . multivitamin with minerals  1 tablet Oral Daily   Continuous Infusions: . clindamycin (CLEOCIN) IV 300 mg (05/19/19 0906)     LOS: 1 day   Marylu Lund, MD [facility] Pager On Amion  If 7PM-7AM, please contact night-coverage 05/19/2019, 5:02 PM

## 2019-05-20 LAB — CBC
HCT: 43.6 % (ref 39.0–52.0)
Hemoglobin: 14.5 g/dL (ref 13.0–17.0)
MCH: 28.4 pg (ref 26.0–34.0)
MCHC: 33.3 g/dL (ref 30.0–36.0)
MCV: 85.5 fL (ref 80.0–100.0)
Platelets: 286 10*3/uL (ref 150–400)
RBC: 5.1 MIL/uL (ref 4.22–5.81)
RDW: 14.5 % (ref 11.5–15.5)
WBC: 8.7 10*3/uL (ref 4.0–10.5)
nRBC: 0 % (ref 0.0–0.2)

## 2019-05-20 MED ORDER — CLINDAMYCIN HCL 300 MG PO CAPS: 300.0000 mg | ORAL_CAPSULE | Freq: Three times a day (TID) | ORAL | 0 refills | Status: AC

## 2019-05-20 MED ORDER — LISINOPRIL 40 MG PO TABS: 40.0000 mg | ORAL_TABLET | Freq: Every day | ORAL | 0 refills | Status: DC

## 2019-05-20 MED ORDER — LISINOPRIL 40 MG PO TABS: 40.0000 mg | ORAL_TABLET | Freq: Every day | ORAL | Status: DC

## 2019-05-20 NOTE — Discharge Summary (Signed)
Physician Discharge Summary  STEIN WINDHORST ZJQ:[id number] DOB: [birth year] DOA: 05/18/2019  PCP: Terald Sleeper, PA-C  Admit date: 05/18/2019 Discharge date: 05/20/2019  Admitted From: Home Disposition:  Home  Recommendations for Outpatient Follow-up:  1. Follow up with PCP in 1-2 weeks   Discharge Condition:Improved CODE STATUS:Full Diet recommendation: Regular   Brief/Interim Summary: 48 y.o.malewith historylongstanding history of substance abuse including smoking and IV drug abuse: Marijuana, cocaine, methamphetamines and heroin. He apparently was at his parents home and was found unresponsive last night. EMS was summoned and he received 2 mg of Narcan at around 11 PM and repeat dose of 1 mg at 11:15 PM. Patient remembers waking up and being transported to [facility] rocking him ED. He states, he used methamphetamine few weeks back but only heroin yesterday. He apparently injected heroin intravenously through his right upper extremity. He denies any recent cocaine or marijuana use. He states he is compliant with his home medications usually. He denies ever being on methadone or Suboxone but apparently was admitted to a rehab center ([facility]) more than 10 years back for cocaine addiction. He denies any history of severe opiate withdrawals requiring hospitalizations. He denies any history of seizures related to substance use. He apparently was hypoxic on arrival to [facility] rocking him ED and required 4 L of O2. His chest x-ray there showed patchy perihilar infiltrates raising possibility of pulmonary edema/infiltrates/ARDS. He received 1 dose of Rocephin and azithromycin there. He was transferred here for further work-up. Call with screening test has been sent here but pending results currently. Patient does report feeling short of breath and describes as tight feeling on taking a deep breath. He states he has noticed some dyspnea on exertion over the last year but slightly worse  overnight. He does smoke 1 pack/day cigarettes. Patient denies any recent fevers. Has some cough this morning which is new. He was in jail about 3 months back but denies any recent contacts with known coronavirus patients.   Discharge Diagnoses:  Active Problems:   Heroin overdose (HCC)   Smoking   Acute respiratory failure with hypoxia    1.Toxic encephalopathy secondary to heroin overdose:Patient responded to 2 doses of Narcan (required total of 3 mg) by EMS. Continued with PRN Narcan available in case of recurrence. Clonidine as needed for withdrawals. Patient currently stable with no active signs of withdrawal. BP stable, albeit suboptimally controlled. -Resolved  2.Acute hypoxic respiratory failure secondary to aspiration PNA: Present on admission. Patient was requiring 4 L of nasal cannula when transported here.  -now on minimal O2 support -Cont bronchodilator as needed -ambulated on room air  3.Leukocytosis:  -Likely secondary to aspiration PNA. Pt not septic (afebrile, RR and HR stable) -WBC had worsened to peak of 22k, improved today but still elevated at 12k -Continue on IV clindamycin. Pt has hx of PCN allergy -To complete course of PO clindamycin on discharge  4.Prolonged QTC:In the setting of IV heroin use. Avoid QT prolonging agents. Held quetiapine for now.  -Repeat EKG reviewed, resolved with qtc of 444  5.Hypertension:Resume home medications including Norvasc and lisinopril. Held Aldactone for now.  -Continue with 2 g sodium diet   6.Polysubstance abuse: -Known Hep C, confirmed on serologies -HIV neg -Cessation done at bedside. Pt agreeable to treatment program after d/c. Have consulted case manager for list of resource programs  7.Hypo-thyroidism:  -Continue with Synthroid as tolerated.  -TSH within normal limits at 2.667  8. Mild Troponemia: -Trop slighly trending down -No chest  pains -2d echo  reviewed   Discharge Instructions   Allergies as of 05/20/2019      Reactions   Penicillins Other (See Comments)   Unknown childhood reaction Did it involve swelling of the face/tongue/throat, SOB, or low BP? Unknown Did it involve sudden or severe rash/hives, skin peeling, or any reaction on the inside of your mouth or nose? Unknown Did you need to seek medical attention at a hospital or doctor's office? Unknown When did it last happen?childhood If all above answers are "NO", may proceed with cephalosporin use.   Ivp Dye [iodinated Diagnostic Agents] Nausea And Vomiting      Medication List    STOP taking these medications   spironolactone 50 MG tablet Commonly known as: ALDACTONE     TAKE these medications   amLODipine 10 MG tablet Commonly known as: NORVASC Take 1 tablet (10 mg total) by mouth daily.   clindamycin 300 MG capsule Commonly known as: Cleocin Take 1 capsule (300 mg total) by mouth 3 (three) times daily for 5 days.   gabapentin 800 MG tablet Commonly known as: NEURONTIN Take 1 tablet (800 mg total) by mouth 3 (three) times daily.   levothyroxine 75 MCG tablet Commonly known as: SYNTHROID Take 1 tablet (75 mcg total) by mouth every morning.   lisinopril 40 MG tablet Commonly known as: ZESTRIL Take 1 tablet (40 mg total) by mouth daily for 30 days. Start taking on: May 21, 2019 What changed:   medication strength  how much to take   QUEtiapine 100 MG tablet Commonly known as: SEROquel Take 1-2 tablets (100-200 mg total) by mouth at bedtime. What changed: how much to take      Follow-up Information    Remus LofflerJones, Angel S, PA-C Follow up on 05/28/2019.   Specialties: Physician Assistant, Family Medicine Why: telephone visit at 10 am Contact information: [address] [place] [place] [address] [phone]          Allergies  Allergen Reactions  . Penicillins Other (See Comments)    Unknown childhood reaction Did it involve swelling of  the face/tongue/throat, SOB, or low BP? Unknown Did it involve sudden or severe rash/hives, skin peeling, or any reaction on the inside of your mouth or nose? Unknown Did you need to seek medical attention at a hospital or doctor's office? Unknown When did it last happen?childhood If all above answers are "NO", may proceed with cephalosporin use.  Ardine Bjork. Ivp Dye [Iodinated Diagnostic Agents] Nausea And Vomiting     Procedures/Studies: Portable Chest 1 View  Result Date: 05/18/2019 CLINICAL DATA:  Dyspnea, smoker, hypertension EXAM: PORTABLE CHEST 1 VIEW COMPARISON:  Portable exam 1111 hours compared to 06/28/2018 FINDINGS: Borderline enlargement of cardiac silhouette. Mediastinal contours normal. BILATERAL perihilar infiltrates which could represent pulmonary edema or multifocal infection. Minimal central peribronchial thickening. No pleural effusion or pneumothorax. IMPRESSION: Bronchitic changes with BILATERAL perihilar infiltrates question edema versus infection. Electronically Signed   By: Ulyses SouthwardMark  Boles M.D.   On: 05/18/2019 12:50     Subjective: Very eager to go home  Discharge Exam: Vitals:   05/20/19 0826 05/20/19 0911  BP: (!) 193/99 (!) 167/78  Pulse: 80   Resp: 16   Temp: 98.7 F (37.1 C)   SpO2: 98%    Vitals:   05/19/19 1800 05/19/19 2314 05/20/19 0826 05/20/19 0911  BP: (!) 173/106 (!) 145/98 (!) 193/99 (!) 167/78  Pulse:  75 80   Resp: (!) 24 (!) 21 16   Temp:  98  F (36.7 C) 98.7 F (37.1 C)   TempSrc:  Oral Oral   SpO2:  98% 98%   Weight:      Height:        General: Pt is alert, awake, not in acute distress Cardiovascular: RRR, S1/S2 +, no rubs, no gallops Respiratory: CTA bilaterally, no wheezing, no rhonchi Abdominal: Soft, NT, ND, bowel sounds + Extremities: no edema, no cyanosis   The results of significant diagnostics from this hospitalization (including imaging, microbiology, ancillary and laboratory) are listed below for reference.      Microbiology: Recent Results (from the past 240 hour(s))  MRSA PCR Screening     Status: None   Collection Time: 05/18/19  6:26 AM   Specimen: Nasal Mucosa; Nasopharyngeal  Result Value Ref Range Status   MRSA by PCR NEGATIVE NEGATIVE Final    Comment:        The GeneXpert MRSA Assay (FDA approved for NASAL specimens only), is one component of a comprehensive MRSA colonization surveillance program. It is not intended to diagnose MRSA infection nor to guide or monitor treatment for MRSA infections. Performed at [facility], [address] [place], [place] [address]   SARS Coronavirus 2     Status: None   Collection Time: 05/18/19  8:20 AM  Result Value Ref Range Status   SARS Coronavirus 2 NOT DETECTED NOT DETECTED Final    Comment: (NOTE) SARS-CoV-2 target nucleic acids are NOT DETECTED. The SARS-CoV-2 RNA is generally detectable in upper and lower respiratory specimens during the acute phase of infection.  Negative  results do not preclude SARS-CoV-2 infection, do not rule out co-infections with other pathogens, and should not be used as the sole basis for treatment or other patient management decisions.  Negative results must be combined with clinical observations, patient history, and epidemiological information. The expected result is Not Detected. Fact Sheet for Patients: http://www.biofiredefense.com/wp-content/uploads/2020/03/BIOFIRE-COVID -19-patients.pdf Fact Sheet for Healthcare Providers: http://www.biofiredefense.com/wp-content/uploads/2020/03/BIOFIRE-COVID -19-hcp.pdf This test is not yet approved or cleared by the [place] FDA and  has been authorized for detection and/or diagnosis of SARS-CoV-2 by FDA under an Emergency Use Authorization (EUA).  This EUA will remain in effec t (meaning this test can be used) for the duration of  the COVID-19 declaration under Section 564(b)(1) of the Act, 21 U.S.C. section 360bbb-3(b)(1), unless the  authorization is terminated or revoked sooner. Performed at [facility], [address] [place], [place] [address]   Culture, blood (routine x 2)     Status: None (Preliminary result)   Collection Time: 05/18/19  8:33 AM   Specimen: BLOOD RIGHT HAND  Result Value Ref Range Status   Specimen Description BLOOD RIGHT HAND  Final   Special Requests   Final    BOTTLES DRAWN AEROBIC AND ANAEROBIC Blood Culture adequate volume   Culture   Final    NO GROWTH 1 DAY Performed at [facility], [address] [place], [place] [address]    Report Status PENDING  Incomplete  Culture, blood (routine x 2)     Status: None (Preliminary result)   Collection Time: 05/18/19  8:39 AM   Specimen: BLOOD RIGHT HAND  Result Value Ref Range Status   Specimen Description BLOOD RIGHT HAND  Final   Special Requests   Final    BOTTLES DRAWN AEROBIC ONLY Blood Culture adequate volume   Culture   Final    NO GROWTH 1 DAY Performed at [facility], [address] [place], [place] [address]    Report Status PENDING  Incomplete     Labs: BNP (last 3 results) Recent Labs    05/18/19 1031  BNP 80.5   Basic Metabolic Panel: Recent Labs  Lab 05/18/19 0833 05/18/19 1031  NA 131*  --   K 3.9  --   CL 97*  --   CO2 25  --   GLUCOSE 94  --   BUN 22*  --   CREATININE 1.16  --   CALCIUM 8.1*  --   MG  --  2.1   Liver Function Tests: Recent Labs  Lab 05/18/19 0833  AST 46*  ALT 77*  ALKPHOS 61  BILITOT 0.6  PROT 7.1  ALBUMIN 3.2*   No results for input(s): LIPASE, AMYLASE in the last 168 hours. No results for input(s): AMMONIA in the last 168 hours. CBC: Recent Labs  Lab 05/18/19 0833 05/19/19 1112 05/20/19 0801  WBC 22.3* 12.0* 8.7  NEUTROABS 20.1*  --   --   HGB 13.3 13.5 14.5  HCT 41.1 41.2 43.6  MCV 87.3 86.2 85.5  PLT 210 226 286   Cardiac Enzymes: Recent Labs  Lab 05/18/19 0833 05/18/19 1616  TROPONINI 0.06* 0.05*   BNP: Invalid input(s):  POCBNP CBG: No results for input(s): GLUCAP in the last 168 hours. D-Dimer Recent Labs    05/18/19 0833  DDIMER 0.56*   Hgb A1c No results for input(s): HGBA1C in the last 72 hours. Lipid Profile Recent Labs    05/19/19 0802  CHOL 104  HDL 32*  LDLCALC 57  TRIG 76  CHOLHDL 3.3   Thyroid function studies Recent Labs    05/19/19 0802  TSH 2.667   Anemia work up No results for input(s): VITAMINB12, FOLATE, FERRITIN, TIBC, IRON, RETICCTPCT in the last 72 hours. Urinalysis No results found for: COLORURINE, APPEARANCEUR, LABSPEC, PHURINE, GLUCOSEU, HGBUR, BILIRUBINUR, KETONESUR, PROTEINUR, UROBILINOGEN, NITRITE, LEUKOCYTESUR Sepsis Labs Invalid input(s): PROCALCITONIN,  WBC,  LACTICIDVEN Microbiology Recent Results (from the past 240 hour(s))  MRSA PCR Screening     Status: None   Collection Time: 05/18/19  6:26 AM   Specimen: Nasal Mucosa; Nasopharyngeal  Result Value Ref Range Status   MRSA by PCR NEGATIVE NEGATIVE Final    Comment:        The GeneXpert MRSA Assay (FDA approved for NASAL specimens only), is one component of a comprehensive MRSA colonization surveillance program. It is not intended to diagnose MRSA infection nor to guide or monitor treatment for MRSA infections. Performed at [facility], [address] [place], [place] [address]   SARS Coronavirus 2     Status: None   Collection Time: 05/18/19  8:20 AM  Result Value Ref Range Status   SARS Coronavirus 2 NOT DETECTED NOT DETECTED Final    Comment: (NOTE) SARS-CoV-2 target nucleic acids are NOT DETECTED. The SARS-CoV-2 RNA is generally detectable in upper and lower respiratory specimens during the acute phase of infection.  Negative  results do not preclude SARS-CoV-2 infection, do not rule out co-infections with other pathogens, and should not be used as the sole basis for treatment or other patient management decisions.  Negative results must be combined with clinical  observations, patient history, and epidemiological information. The expected result is Not Detected. Fact Sheet for Patients: http://www.biofiredefense.com/wp-content/uploads/2020/03/BIOFIRE-COVID -19-patients.pdf Fact Sheet for Healthcare Providers: http://www.biofiredefense.com/wp-content/uploads/2020/03/BIOFIRE-COVID -19-hcp.pdf This test is not yet approved or cleared by the [place] FDA and  has been authorized for detection and/or  diagnosis of SARS-CoV-2 by FDA under an Emergency Use Authorization (EUA).  This EUA will remain in effec t (meaning this test can be used) for the duration of  the COVID-19 declaration under Section 564(b)(1) of the Act, 21 U.S.C. section 360bbb-3(b)(1), unless the authorization is terminated or revoked sooner. Performed at [facility], [address] [place], [place] [address]   Culture, blood (routine x 2)     Status: None (Preliminary result)   Collection Time: 05/18/19  8:33 AM   Specimen: BLOOD RIGHT HAND  Result Value Ref Range Status   Specimen Description BLOOD RIGHT HAND  Final   Special Requests   Final    BOTTLES DRAWN AEROBIC AND ANAEROBIC Blood Culture adequate volume   Culture   Final    NO GROWTH 1 DAY Performed at [facility], [address], [place], [place] [address]    Report Status PENDING  Incomplete  Culture, blood (routine x 2)     Status: None (Preliminary result)   Collection Time: 05/18/19  8:39 AM   Specimen: BLOOD RIGHT HAND  Result Value Ref Range Status   Specimen Description BLOOD RIGHT HAND  Final   Special Requests   Final    BOTTLES DRAWN AEROBIC ONLY Blood Culture adequate volume   Culture   Final    NO GROWTH 1 DAY Performed at [facility], [address], [place], [place] [address]    Report Status PENDING  Incomplete   Time spent: 30 min  SIGNED:   Rickey Barbara, MD  [facility] 05/20/2019, 12:15 PM  If 7PM-7AM, please contact night-coverage

## 2019-05-23 LAB — CULTURE, BLOOD (ROUTINE X 2)
Culture: NO GROWTH
Culture: NO GROWTH
Special Requests: ADEQUATE
Special Requests: ADEQUATE

## 2019-05-27 ENCOUNTER — Other Ambulatory Visit: Payer: Self-pay | Admitting: Physician Assistant

## 2019-05-27 DIAGNOSIS — Z72820 Sleep deprivation: Secondary | ICD-10-CM

## 2019-05-28 ENCOUNTER — Encounter: Payer: Self-pay | Admitting: Physician Assistant

## 2019-05-28 ENCOUNTER — Ambulatory Visit (INDEPENDENT_AMBULATORY_CARE_PROVIDER_SITE_OTHER): Payer: Self-pay | Admitting: Physician Assistant

## 2019-05-28 ENCOUNTER — Other Ambulatory Visit: Payer: Self-pay

## 2019-05-28 DIAGNOSIS — Z72820 Sleep deprivation: Secondary | ICD-10-CM

## 2019-05-28 DIAGNOSIS — F411 Generalized anxiety disorder: Secondary | ICD-10-CM

## 2019-05-28 DIAGNOSIS — G894 Chronic pain syndrome: Secondary | ICD-10-CM

## 2019-05-28 DIAGNOSIS — F1910 Other psychoactive substance abuse, uncomplicated: Secondary | ICD-10-CM

## 2019-05-28 DIAGNOSIS — F331 Major depressive disorder, recurrent, moderate: Secondary | ICD-10-CM

## 2019-05-28 MED ORDER — QUETIAPINE FUMARATE 100 MG PO TABS: 100.0000 mg | ORAL_TABLET | Freq: Every day | ORAL | 5 refills | Status: AC

## 2019-05-28 MED ORDER — GABAPENTIN 800 MG PO TABS: 800.0000 mg | ORAL_TABLET | Freq: Three times a day (TID) | ORAL | 5 refills | Status: AC

## 2019-05-28 NOTE — Progress Notes (Signed)
Telephone visit  Subjective: ZO:XWRUEAVCC:chronic pain, OD PCP: Remus LofflerJones, Kristene Liberati S, PA-C WUJ:[id number]:Rani L Carleene Cooperuggle is a 48 y.o. male calls for telephone consult today. Patient provides verbal consent for consult held via phone.  Patient is identified with 2 separate identifiers.  At this time the entire area is on COVID-19 social distancing and stay home orders are in place.  Patient is of higher risk and therefore we are performing this by a virtual method.  Location of patient: home  Location of provider: HOME Others present for call: no  This patient is having a follow-up from his hospital admission.  He had a heroin overdose.  He was treated there and has been doing better.  He did have some infection but no sepsis was found.  He states that overall he has not been feeling very good.  He has been trying to take his GABA [place] and Seroquel.  However financially he is not able to get the medications.  We have discussed him using the good Rx program to get his medications, maybe even try different pharmacy.  He is highly recommended to get [facility].   ROS: Per HPI  Allergies  Allergen Reactions  . Penicillins Other (See Comments)    Unknown childhood reaction Did it involve swelling of the face/tongue/throat, SOB, or low BP? Unknown Did it involve sudden or severe rash/hives, skin peeling, or any reaction on the inside of your mouth or nose? Unknown Did you need to seek medical attention at a hospital or doctor's office? Unknown When did it last happen?childhood If all above answers are "NO", may proceed with cephalosporin use.  Berle Mull. Ivp Dye [Iodinated Diagnostic Agents] Nausea And Vomiting   Past Medical History:  Diagnosis Date  . Chronic back pain   . Hypertension   . Thyroid disease     Current Outpatient Medications:  .  amLODipine (NORVASC) 10 MG tablet, Take 1 tablet (10 mg total) by mouth daily. (Patient not taking: Reported on 05/18/2019), Disp: 30  tablet, Rfl: 5 .  gabapentin (NEURONTIN) 800 MG tablet, Take 1 tablet (800 mg total) by mouth 3 (three) times daily., Disp: 270 tablet, Rfl: 5 .  levothyroxine (SYNTHROID, LEVOTHROID) 75 MCG tablet, Take 1 tablet (75 mcg total) by mouth every morning. (Patient not taking: Reported on 05/18/2019), Disp: 30 tablet, Rfl: 5 .  lisinopril (PRINIVIL,ZESTRIL) 20 MG tablet, Take 1 tablet (20 mg total) by mouth daily. (Patient not taking: Reported on 05/18/2019), Disp: 30 tablet, Rfl: 5 .  QUEtiapine (SEROQUEL) 100 MG tablet, Take 1-2 tablets (100-200 mg total) by mouth at bedtime., Disp: 60 tablet, Rfl: 5 .  spironolactone (ALDACTONE) 50 MG tablet, Take 1 tablet (50 mg total) by mouth daily. (Patient not taking: Reported on 05/18/2019), Disp: 30 tablet, Rfl: 5  Assessment/ Plan: 48 y.o. male   1. Chronic pain syndrome - gabapentin (NEURONTIN) 800 MG tablet; Take 1 tablet (800 mg total) by mouth 3 (three) times daily.  Dispense: 270 tablet; Refill: 5  2. Poor sleep - QUEtiapine (SEROQUEL) 100 MG tablet; Take 1-2 tablets (100-200 mg total) by mouth at bedtime.  Dispense: 60 tablet; Refill: 5  3. Polysubstance abuse (HCC) Mental health referral  4. Moderate episode of recurrent major depressive disorder (HCC) Mental health referral  5. Generalized anxiety disorder Mental health referral   Continue all other maintenance medications as listed above.  Start time: 10:10 AM End time: 10:18 AM  Meds ordered this encounter  Medications  .  gabapentin (NEURONTIN) 800 MG tablet    Sig: Take 1 tablet (800 mg total) by mouth 3 (three) times daily.    Dispense:  270 tablet    Refill:  5    Order Specific Question:   Supervising Provider    Answer:   Janora Norlander [[id number]]  . QUEtiapine (SEROQUEL) 100 MG tablet    Sig: Take 1-2 tablets (100-200 mg total) by mouth at bedtime.    Dispense:  60 tablet    Refill:  5    Order Specific Question:   Supervising Provider    Answer:   Janora Norlander [[id number]]    Particia Nearing PA-C [place] [phone]

## 2019-05-28 NOTE — Telephone Encounter (Signed)
Please Review

## 2019-05-28 NOTE — Patient Instructions (Signed)
Your provider wants you to schedule an appointment with a Psychologist/Psychiatrist. The following list of offices requires the patient to call and make their own appointment, as there is information they need that only you can provide. Please feel free to choose form the following providers:  [facility]   [phone] Crisis Recovery in [place] [phone]  [facility]  [phone]   [address] [place], [place]  (Scheduled through [facility]) Must call and do an interview for appointment. Sees Children / Accepts Medicaid  [facility]    [phone]  [address]    [place], [place]        [facility]  [phone] [address] [place], [place]  Evaluates for Autism but does not treat it Sees Children / Accepts Medicaid  [facility]    [phone] [address]   [place], [place] Medication management, substance abuse, bipolar, grief, family, marriage, OCD, anxiety, PTSD Sees children / Accepts Medicaid  [facility]    [phone] [address] [place], [place] Sees children / Accepts Medicaid  [facility]  [phone] [address] [place], [place]   Dr Akinlayo     [phone] [address] [place] [address] [place], [place]  Sees ADD & ADHD for treatment Accepts Medicaid  [facility]  [phone] [address] [place], [place] Evaluates for Autism Accepts Medicaid  [facility]  [phone] [address] [place], [place]  Does Adult ADD evaluations Does not accept Medicaid  [facility]   [phone] [address]   [place], [place] Uses animal therapy  Sees children as young as 3 years old Accepts Medicaid  [facility]     [phone]    [address]  , [place] [address] Sees children Accepts Medicaid  

## 2019-06-30 DEATH — deceased

## 2020-10-03 IMAGING — DX PORTABLE CHEST - 1 VIEW
1 series · 1 of 1 positions shown · non-contrast
Comparison: Portable exam 9999 hours compared to 06/28/2018

CLINICAL DATA: Dyspnea, smoker, hypertension

EXAM:
PORTABLE CHEST 1 VIEW

[chest ap]
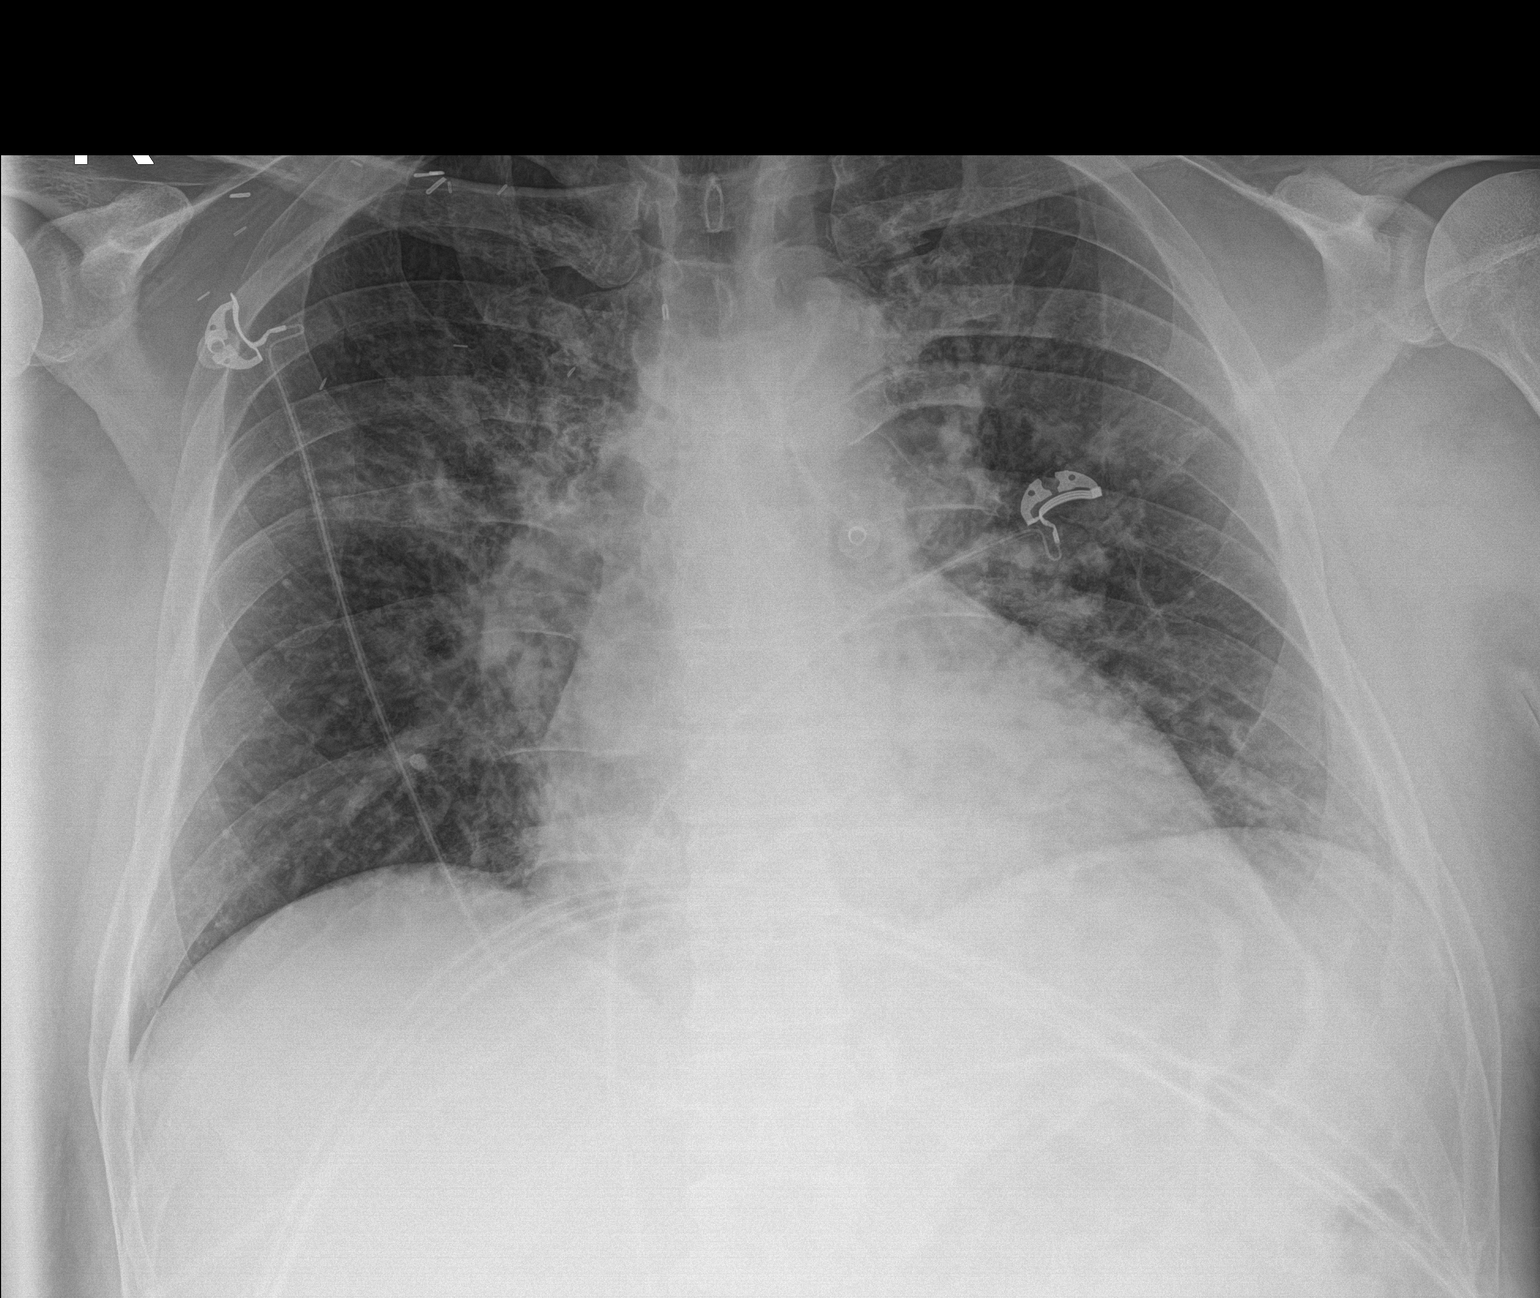

[1 of 1 positions shown; findings below may reference images not displayed]

FINDINGS: Borderline enlargement of cardiac silhouette.

Mediastinal contours normal.

BILATERAL perihilar infiltrates which could represent pulmonary
edema or multifocal infection.

Minimal central peribronchial thickening.

No pleural effusion or pneumothorax.
IMPRESSION: Bronchitic changes with BILATERAL perihilar infiltrates question
edema versus infection.
